# Patient Record
Sex: Female | Born: 1955 | Race: Black or African American | Hispanic: No | Marital: Married | Smoking: Current some day smoker
Health system: Southern US, Community
[De-identification: ages and names within clinical notes are randomized; demographics above are authoritative.]

## PROBLEM LIST (undated history)

## (undated) DIAGNOSIS — I10 Essential (primary) hypertension: Secondary | ICD-10-CM

## (undated) HISTORY — PX: TONSILLECTOMY: SUR1361

## (undated) HISTORY — PX: ABDOMINAL HYSTERECTOMY: SHX81

---

## 1998-10-16 ENCOUNTER — Emergency Department (HOSPITAL_COMMUNITY): Admission: EM | Admit: 1998-10-16 | Discharge: 1998-10-16 | Payer: Self-pay | Admitting: Emergency Medicine

## 1999-12-12 ENCOUNTER — Emergency Department (HOSPITAL_COMMUNITY): Admission: EM | Admit: 1999-12-12 | Discharge: 1999-12-12 | Payer: Self-pay | Admitting: Emergency Medicine

## 1999-12-13 ENCOUNTER — Encounter: Payer: Self-pay | Admitting: Emergency Medicine

## 2000-01-28 ENCOUNTER — Emergency Department (HOSPITAL_COMMUNITY): Admission: EM | Admit: 2000-01-28 | Discharge: 2000-01-28 | Payer: Self-pay | Admitting: Emergency Medicine

## 2000-04-02 ENCOUNTER — Emergency Department (HOSPITAL_COMMUNITY): Admission: EM | Admit: 2000-04-02 | Discharge: 2000-04-02 | Payer: Self-pay | Admitting: Emergency Medicine

## 2000-06-05 ENCOUNTER — Emergency Department (HOSPITAL_COMMUNITY): Admission: EM | Admit: 2000-06-05 | Discharge: 2000-06-05 | Payer: Self-pay | Admitting: Emergency Medicine

## 2000-11-25 ENCOUNTER — Emergency Department (HOSPITAL_COMMUNITY): Admission: EM | Admit: 2000-11-25 | Discharge: 2000-11-26 | Payer: Self-pay | Admitting: *Deleted

## 2002-11-03 ENCOUNTER — Emergency Department (HOSPITAL_COMMUNITY): Admission: EM | Admit: 2002-11-03 | Discharge: 2002-11-03 | Payer: Self-pay | Admitting: Emergency Medicine

## 2004-07-13 ENCOUNTER — Emergency Department (HOSPITAL_COMMUNITY): Admission: EM | Admit: 2004-07-13 | Discharge: 2004-07-13 | Payer: Self-pay | Admitting: Podiatry

## 2006-08-21 ENCOUNTER — Emergency Department (HOSPITAL_COMMUNITY): Admission: EM | Admit: 2006-08-21 | Discharge: 2006-08-21 | Payer: Self-pay | Admitting: Emergency Medicine

## 2006-09-09 ENCOUNTER — Emergency Department (HOSPITAL_COMMUNITY): Admission: EM | Admit: 2006-09-09 | Discharge: 2006-09-09 | Payer: Self-pay | Admitting: Emergency Medicine

## 2007-08-13 ENCOUNTER — Encounter (INDEPENDENT_AMBULATORY_CARE_PROVIDER_SITE_OTHER): Payer: Self-pay | Admitting: Emergency Medicine

## 2007-08-13 ENCOUNTER — Ambulatory Visit: Payer: Self-pay | Admitting: Vascular Surgery

## 2007-08-13 ENCOUNTER — Emergency Department (HOSPITAL_COMMUNITY): Admission: EM | Admit: 2007-08-13 | Discharge: 2007-08-13 | Payer: Self-pay | Admitting: Emergency Medicine

## 2007-08-24 ENCOUNTER — Ambulatory Visit: Payer: Self-pay | Admitting: *Deleted

## 2007-08-24 ENCOUNTER — Ambulatory Visit: Payer: Self-pay | Admitting: Internal Medicine

## 2007-08-24 LAB — CONVERTED CEMR LAB
Chloride: 107 meq/L (ref 96–112)
HDL: 56 mg/dL (ref >39)
LDL Cholesterol: 75 mg/dL (ref 0–99)
Potassium: 4.1 meq/L (ref 3.5–5.3)
Triglycerides: 125 mg/dL (ref <150)
VLDL: 25 mg/dL (ref 0–40)

## 2007-11-21 ENCOUNTER — Ambulatory Visit: Payer: Self-pay | Admitting: Internal Medicine

## 2008-11-25 ENCOUNTER — Emergency Department (HOSPITAL_COMMUNITY): Admission: EM | Admit: 2008-11-25 | Discharge: 2008-11-25 | Payer: Self-pay | Admitting: Emergency Medicine

## 2009-03-11 ENCOUNTER — Ambulatory Visit: Payer: Self-pay | Admitting: Internal Medicine

## 2009-03-12 ENCOUNTER — Encounter (INDEPENDENT_AMBULATORY_CARE_PROVIDER_SITE_OTHER): Payer: Self-pay | Admitting: Internal Medicine

## 2009-03-25 ENCOUNTER — Encounter: Admission: RE | Admit: 2009-03-25 | Discharge: 2009-05-14 | Payer: Self-pay | Admitting: Internal Medicine

## 2009-04-23 ENCOUNTER — Ambulatory Visit: Payer: Self-pay | Admitting: Internal Medicine

## 2009-04-30 ENCOUNTER — Ambulatory Visit (HOSPITAL_COMMUNITY): Admission: RE | Admit: 2009-04-30 | Discharge: 2009-04-30 | Payer: Self-pay | Admitting: Internal Medicine

## 2009-05-14 ENCOUNTER — Ambulatory Visit: Payer: Self-pay | Admitting: Internal Medicine

## 2009-08-11 ENCOUNTER — Ambulatory Visit: Payer: Self-pay | Admitting: Internal Medicine

## 2009-08-16 ENCOUNTER — Observation Stay (HOSPITAL_COMMUNITY): Admission: EM | Admit: 2009-08-16 | Discharge: 2009-08-16 | Payer: Self-pay | Admitting: Emergency Medicine

## 2009-08-18 ENCOUNTER — Emergency Department (HOSPITAL_COMMUNITY): Admission: EM | Admit: 2009-08-18 | Discharge: 2009-08-18 | Payer: Self-pay | Admitting: Family Medicine

## 2009-10-13 ENCOUNTER — Encounter (INDEPENDENT_AMBULATORY_CARE_PROVIDER_SITE_OTHER): Payer: Self-pay | Admitting: Adult Health

## 2009-10-13 ENCOUNTER — Ambulatory Visit: Payer: Self-pay | Admitting: Internal Medicine

## 2009-10-13 LAB — CONVERTED CEMR LAB
AST: 15 units/L (ref 0–37)
Alkaline Phosphatase: 87 units/L (ref 39–117)
BUN: 15 mg/dL (ref 6–23)
Creatinine, Ser: 0.88 mg/dL (ref 0.40–1.20)
HDL: 53 mg/dL (ref >39)
LDL Cholesterol: 92 mg/dL (ref 0–99)
Total Bilirubin: 0.5 mg/dL (ref 0.3–1.2)
Total CHOL/HDL Ratio: 3.1

## 2010-02-03 ENCOUNTER — Ambulatory Visit: Payer: Self-pay | Admitting: Internal Medicine

## 2011-01-21 ENCOUNTER — Emergency Department (HOSPITAL_COMMUNITY)
Admission: EM | Admit: 2011-01-21 | Discharge: 2011-01-21 | Disposition: A | Discharge status: Home / Self Care | Payer: Self-pay | Attending: Emergency Medicine | Admitting: Emergency Medicine

## 2011-01-21 DIAGNOSIS — I10 Essential (primary) hypertension: Secondary | ICD-10-CM | POA: Insufficient documentation

## 2011-01-21 DIAGNOSIS — E669 Obesity, unspecified: Secondary | ICD-10-CM | POA: Insufficient documentation

## 2011-01-21 DIAGNOSIS — R51 Headache: Secondary | ICD-10-CM | POA: Insufficient documentation

## 2011-01-21 DIAGNOSIS — H5710 Ocular pain, unspecified eye: Secondary | ICD-10-CM | POA: Insufficient documentation

## 2011-01-21 DIAGNOSIS — I2510 Atherosclerotic heart disease of native coronary artery without angina pectoris: Secondary | ICD-10-CM | POA: Insufficient documentation

## 2011-02-12 ENCOUNTER — Encounter (INDEPENDENT_AMBULATORY_CARE_PROVIDER_SITE_OTHER): Payer: Self-pay | Admitting: *Deleted

## 2011-02-12 LAB — CONVERTED CEMR LAB
Calcium: 8.9 mg/dL (ref 8.4–10.5)
Glucose, Bld: 96 mg/dL (ref 70–99)
Sodium: 140 meq/L (ref 135–145)

## 2011-07-20 ENCOUNTER — Emergency Department (HOSPITAL_COMMUNITY): Payer: Self-pay

## 2011-07-20 ENCOUNTER — Emergency Department (HOSPITAL_COMMUNITY)
Admission: EM | Admit: 2011-07-20 | Discharge: 2011-07-20 | Disposition: A | Discharge status: Home / Self Care | Payer: Self-pay | Attending: Emergency Medicine | Admitting: Emergency Medicine

## 2011-07-20 DIAGNOSIS — I2510 Atherosclerotic heart disease of native coronary artery without angina pectoris: Secondary | ICD-10-CM | POA: Insufficient documentation

## 2011-07-20 DIAGNOSIS — K6289 Other specified diseases of anus and rectum: Secondary | ICD-10-CM | POA: Insufficient documentation

## 2011-07-20 DIAGNOSIS — K602 Anal fissure, unspecified: Secondary | ICD-10-CM | POA: Insufficient documentation

## 2011-07-20 DIAGNOSIS — R109 Unspecified abdominal pain: Secondary | ICD-10-CM | POA: Insufficient documentation

## 2011-07-20 DIAGNOSIS — I10 Essential (primary) hypertension: Secondary | ICD-10-CM | POA: Insufficient documentation

## 2011-07-20 DIAGNOSIS — K644 Residual hemorrhoidal skin tags: Secondary | ICD-10-CM | POA: Insufficient documentation

## 2011-07-20 LAB — URINE MICROSCOPIC-ADD ON

## 2011-07-20 LAB — URINALYSIS, ROUTINE W REFLEX MICROSCOPIC
Glucose, UA: NEGATIVE mg/dL
Nitrite: NEGATIVE
Protein, ur: NEGATIVE mg/dL
Urobilinogen, UA: 0.2 mg/dL (ref 0.0–1.0)

## 2011-07-20 LAB — POCT I-STAT, CHEM 8
BUN: 15 mg/dL (ref 6–23)
Chloride: 101 mEq/L (ref 96–112)
Sodium: 140 mEq/L (ref 135–145)
TCO2: 30 mmol/L (ref 0–100)

## 2011-07-20 MED ORDER — IOHEXOL 300 MG/ML  SOLN
100.0000 mL | Freq: Once | INTRAMUSCULAR | Status: AC | PRN
  Administered 2011-07-20: 100 mL via INTRAVENOUS

## 2014-02-21 ENCOUNTER — Emergency Department (HOSPITAL_COMMUNITY): Payer: PRIVATE HEALTH INSURANCE

## 2014-02-21 ENCOUNTER — Emergency Department (HOSPITAL_COMMUNITY)
Admission: EM | Admit: 2014-02-21 | Discharge: 2014-02-21 | Disposition: A | Discharge status: Home / Self Care | Payer: PRIVATE HEALTH INSURANCE | Attending: Emergency Medicine | Admitting: Emergency Medicine

## 2014-02-21 ENCOUNTER — Encounter (HOSPITAL_COMMUNITY): Payer: Self-pay | Admitting: Emergency Medicine

## 2014-02-21 DIAGNOSIS — M25512 Pain in left shoulder: Secondary | ICD-10-CM

## 2014-02-21 DIAGNOSIS — F17200 Nicotine dependence, unspecified, uncomplicated: Secondary | ICD-10-CM | POA: Insufficient documentation

## 2014-02-21 DIAGNOSIS — Y9389 Activity, other specified: Secondary | ICD-10-CM | POA: Insufficient documentation

## 2014-02-21 DIAGNOSIS — I10 Essential (primary) hypertension: Secondary | ICD-10-CM | POA: Insufficient documentation

## 2014-02-21 DIAGNOSIS — Z880 Allergy status to penicillin: Secondary | ICD-10-CM | POA: Insufficient documentation

## 2014-02-21 DIAGNOSIS — S199XXA Unspecified injury of neck, initial encounter: Principal | ICD-10-CM

## 2014-02-21 DIAGNOSIS — M542 Cervicalgia: Secondary | ICD-10-CM

## 2014-02-21 DIAGNOSIS — S4980XA Other specified injuries of shoulder and upper arm, unspecified arm, initial encounter: Secondary | ICD-10-CM | POA: Insufficient documentation

## 2014-02-21 DIAGNOSIS — S46909A Unspecified injury of unspecified muscle, fascia and tendon at shoulder and upper arm level, unspecified arm, initial encounter: Secondary | ICD-10-CM | POA: Insufficient documentation

## 2014-02-21 DIAGNOSIS — S0993XA Unspecified injury of face, initial encounter: Secondary | ICD-10-CM | POA: Insufficient documentation

## 2014-02-21 DIAGNOSIS — Y92410 Unspecified street and highway as the place of occurrence of the external cause: Secondary | ICD-10-CM | POA: Insufficient documentation

## 2014-02-21 HISTORY — DX: Essential (primary) hypertension: I10

## 2014-02-21 MED ORDER — HYDROCODONE-ACETAMINOPHEN 5-325 MG PO TABS: 1.0000 | ORAL_TABLET | ORAL | Status: DC | PRN

## 2014-02-21 MED ORDER — OXYCODONE-ACETAMINOPHEN 5-325 MG PO TABS
1.0000 | ORAL_TABLET | Freq: Once | ORAL | Status: AC
  Administered 2014-02-21: 1 via ORAL
  Filled 2014-02-21: qty 1

## 2014-02-21 NOTE — ED Notes (Signed)
Patient transported to CT 
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
## 2014-02-21 NOTE — Discharge Instructions (Signed)
Take the prescribed medication as directed.  You will continue to be sore for the next several days.  Wear collar if it helps your neck pain. Return to the ED for new or worsening symptoms.
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
## 2014-02-24 NOTE — ED Provider Notes (Signed)
Medical screening examination/treatment/procedure(s) were performed by non-physician practitioner and as supervising physician I was immediately available for consultation/collaboration.   EKG Interpretation None        Candyce ChurnJohn David Tacie Mccuistion III, MD 02/24/14 1254

## 2016-05-04 ENCOUNTER — Emergency Department (HOSPITAL_COMMUNITY)
Admission: EM | Admit: 2016-05-04 | Discharge: 2016-05-05 | Disposition: A | Discharge status: Home / Self Care | Payer: Medicare Other | Attending: Emergency Medicine | Admitting: Emergency Medicine

## 2016-05-04 ENCOUNTER — Encounter (HOSPITAL_COMMUNITY): Payer: Self-pay | Admitting: *Deleted

## 2016-05-04 DIAGNOSIS — Z880 Allergy status to penicillin: Secondary | ICD-10-CM | POA: Diagnosis not present

## 2016-05-04 DIAGNOSIS — R350 Frequency of micturition: Secondary | ICD-10-CM | POA: Diagnosis present

## 2016-05-04 DIAGNOSIS — N390 Urinary tract infection, site not specified: Secondary | ICD-10-CM

## 2016-05-04 DIAGNOSIS — I10 Essential (primary) hypertension: Secondary | ICD-10-CM | POA: Diagnosis not present

## 2016-05-04 DIAGNOSIS — F17200 Nicotine dependence, unspecified, uncomplicated: Secondary | ICD-10-CM | POA: Diagnosis not present

## 2016-05-04 NOTE — ED Notes (Signed)
Patient states she has been having frequent urination for about 1 month.  Denies vaginal discharge or painful urination

## 2016-05-05 LAB — URINE MICROSCOPIC-ADD ON

## 2016-05-05 LAB — URINALYSIS, ROUTINE W REFLEX MICROSCOPIC
GLUCOSE, UA: NEGATIVE mg/dL
KETONES UR: 15 mg/dL — AB
LEUKOCYTES UA: NEGATIVE
Nitrite: NEGATIVE
PROTEIN: NEGATIVE mg/dL
Specific Gravity, Urine: 1.027 (ref 1.005–1.030)
pH: 6 (ref 5.0–8.0)

## 2016-05-05 LAB — CBG MONITORING, ED: Glucose-Capillary: 111 mg/dL — ABNORMAL HIGH (ref 65–99)

## 2016-05-05 MED ORDER — CEPHALEXIN 500 MG PO CAPS: 500.0000 mg | ORAL_CAPSULE | Freq: Four times a day (QID) | ORAL | Status: AC

## 2016-05-05 NOTE — ED Provider Notes (Signed)
TIME SEEN: 12:27 AM  CHIEF COMPLAINT: Urinary frequency  HPI: HPI Comments: Andrea Carr is a 60 y.o. female who presents to the Emergency Department complaining of urinary frequency onset 1 month prior. Pt doesn't report any associated symptoms. Pt states that her symptoms are worse at night when sleeping. Pt states sometimes at night she urinates at night about 10-12 times. Pt doesn't report any alleviating factors. Denies polydipsia, dysuria, hematuria, abdominal pain, vomiting, diarrhea, vaginal bleeding or vaginal discharge. Denies Hx of DM. Pt denies being on an diuretics.  ROS: See HPI Constitutional: no fever  Eyes: no drainage  ENT: no runny nose   Cardiovascular:  no chest pain  Resp: no SOB  GI: no vomiting GU: no dysuria Integumentary: no rash  Allergy: no hives  Musculoskeletal: no leg swelling  Neurological: no slurred speech ROS otherwise negative  PAST MEDICAL HISTORY/PAST SURGICAL HISTORY:  Past Medical History  Diagnosis Date  . Hypertension     MEDICATIONS:  Prior to Admission medications   Medication Sig Start Date End Date Taking? Authorizing Provider  acetaminophen (TYLENOL) 500 MG tablet Take 500 mg by mouth every 6 (six) hours as needed for mild pain.   Yes Historical Provider, MD  ranitidine (ZANTAC) 150 MG capsule Take 150 mg by mouth daily as needed for heartburn.   Yes Historical Provider, MD  HYDROcodone-acetaminophen (NORCO/VICODIN) 5-325 MG per tablet Take 1 tablet by mouth every 4 (four) hours as needed. Patient not taking: Reported on 05/05/2016 02/21/14   Garlon Hatchet, PA-C    ALLERGIES:  Allergies  Allergen Reactions  . Lisinopril Swelling  . Penicillins     Unknown Has patient had a PCN reaction causing immediate rash, facial/tongue/throat swelling, SOB or lightheadedness with hypotension:YES Has patient had a PCN reaction causing severe rash involving mucus membranes or skin necrosis:NO Has patient had a PCN reaction that required  hospitalization NO Has patient had a PCN reaction occurring within the last 10 years: NO If all of the above answers are "NO", then may proceed with Cephalosporin use.    SOCIAL HISTORY:  Social History  Substance Use Topics  . Smoking status: Current Some Day Smoker  . Smokeless tobacco: Never Used  . Alcohol Use: No    FAMILY HISTORY: No family history on file.  EXAM: BP 176/95 mmHg  Pulse 76  Temp(Src) 98.3 F (36.8 C) (Oral)  Resp 20  Ht  (1.676 m)  Wt 350 lb (158.759 kg)  BMI 56.52 kg/m2  SpO2 96% CONSTITUTIONAL: Alert and oriented and responds appropriately to questions. Well-appearing; well-nourished, Morbidly obese HEAD: Normocephalic EYES: Conjunctivae clear, PERRL ENT: normal nose; no rhinorrhea; moist mucous membranes NECK: Supple, no meningismus, no LAD  CARD: RRR; S1 and S2 appreciated; no murmurs, no clicks, no rubs, no gallops RESP: Normal chest excursion without splinting or tachypnea; breath sounds clear and equal bilaterally; no wheezes, no rhonchi, no rales, no hypoxia or respiratory distress, speaking full sentences ABD/GI: Normal bowel sounds; non-distended; soft, non-tender, no rebound, no guarding, no peritoneal signs BACK:  The back appears normal and is non-tender to palpation, there is no CVA tenderness EXT: Normal ROM in all joints; non-tender to palpation; no edema; normal capillary refill; no cyanosis, no calf tenderness or swelling    SKIN: Normal color for age and race; warm; no rash NEURO: Moves all extremities equally, sensation to light touch intact diffusely, cranial nerves II through XII intact PSYCH: The patient's mood and manner are appropriate. Grooming and personal hygiene are  appropriate.  MEDICAL DECISION MAKING: Patient here with urinary frequency for the past month. No abdominal pain, fever, vomiting or diarrhea. No vaginal bleeding or discharge. Abdominal exam is completely benign. Afebrile. Blood glucose normal. Not on  diuretics. Urine shows trace hemoglobin, bacteria. Will treat for possible UTI. Culture is pending. Will discharge on Keflex. She does not have a PCP for follow-up. I have provided her with outpatient follow-up information.  Discussed return precautions.   At this time, I do not feel there is any life-threatening condition present. I have reviewed and discussed all results (EKG, imaging, lab, urine as appropriate), exam findings with patient. I have reviewed nursing notes and appropriate previous records.  I feel the patient is safe to be discharged home without further emergent workup. Discussed usual and customary return precautions. Patient and family (if present) verbalize understanding and are comfortable with this plan.  Patient will follow-up with their primary care provider. If they do not have a primary care provider, information for follow-up has been provided to them. All questions have been answered.   I personally performed the services described in this documentation, which was scribed in my presence. The recorded information has been reviewed and is accurate.     Layla MawKristen N Ward, DO 05/05/16 [phone]

## 2016-05-05 NOTE — Discharge Instructions (Signed)
Urinary Tract Infection °Urinary tract infections (UTIs) can develop anywhere along your urinary tract. Your urinary tract is your body's drainage system for removing wastes and extra water. Your urinary tract includes two kidneys, two ureters, a bladder, and a urethra. Your kidneys are a pair of bean-shaped organs. Each kidney is about the size of your fist. They are located below your ribs, one on each side of your spine. °CAUSES °Infections are caused by microbes, which are microscopic organisms, including fungi, viruses, and bacteria. These organisms are so small that they can only be seen through a microscope. Bacteria are the microbes that most commonly cause UTIs. °SYMPTOMS  °Symptoms of UTIs may vary by age and gender of the patient and by the location of the infection. Symptoms in young women typically include a frequent and intense urge to urinate and a painful, burning feeling in the bladder or urethra during urination. Older women and men are more likely to be tired, shaky, and weak and have muscle aches and abdominal pain. A fever may mean the infection is in your kidneys. Other symptoms of a kidney infection include pain in your back or sides below the ribs, nausea, and vomiting. °DIAGNOSIS °To diagnose a UTI, your caregiver will ask you about your symptoms. Your caregiver will also ask you to provide a urine sample. The urine sample will be tested for bacteria and white blood cells. White blood cells are made by your body to help fight infection. °TREATMENT  °Typically, UTIs can be treated with medication. Because most UTIs are caused by a bacterial infection, they usually can be treated with the use of antibiotics. The choice of antibiotic and length of treatment depend on your symptoms and the type of bacteria causing your infection. °HOME CARE INSTRUCTIONS °· If you were prescribed antibiotics, take them exactly as your caregiver instructs you. Finish the medication even if you feel better after  you have only taken some of the medication. °· Drink enough water and fluids to keep your urine clear or pale yellow. °· Avoid caffeine, tea, and carbonated beverages. They tend to irritate your bladder. °· Empty your bladder often. Avoid holding urine for long periods of time. °· Empty your bladder before and after sexual intercourse. °· After a bowel movement, women should cleanse from front to back. Use each tissue only once. °SEEK MEDICAL CARE IF:  °· You have back pain. °· You develop a fever. °· Your symptoms do not begin to resolve within 3 days. °SEEK IMMEDIATE MEDICAL CARE IF:  °· You have severe back pain or lower abdominal pain. °· You develop chills. °· You have nausea or vomiting. °· You have continued burning or discomfort with urination. °MAKE SURE YOU:  °· Understand these instructions. °· Will watch your condition. °· Will get help right away if you are not doing well or get worse. °  °This information is not intended to replace advice given to you by your health care provider. Make sure you discuss any questions you have with your health care provider. °  °Document Released: 08/25/2005 Document Revised: 08/06/2015 Document Reviewed: 12/24/2011 °Elsevier Interactive Patient Education ©2016 Elsevier Inc. ° ° °To find a primary care or specialty doctor please call [phone] or [phone] to access "[facility] Find a Doctor Service." ° °You may also go on the [facility] website at [email or web address] ° °There are also multiple [facility] and [facility] practices throughout the [place] that are frequently accepting new patients. You may find a clinic that is   is close to your home and contact them.  [facility] -  [address] [place] [id number] [phone]  [facility] in [place] (also locations in [place] and [place]) -  [address] [place] [address] [phone]  [facility] -  [address]  [place] [address] [phone]

## 2016-05-06 LAB — URINE CULTURE

## 2017-11-03 ENCOUNTER — Encounter (HOSPITAL_COMMUNITY): Payer: Self-pay

## 2017-11-03 ENCOUNTER — Emergency Department (HOSPITAL_COMMUNITY)
Admission: EM | Admit: 2017-11-03 | Discharge: 2017-11-03 | Disposition: A | Discharge status: Home / Self Care | Payer: Medicare Other | Attending: Emergency Medicine | Admitting: Emergency Medicine

## 2017-11-03 ENCOUNTER — Other Ambulatory Visit: Payer: Self-pay

## 2017-11-03 DIAGNOSIS — F17210 Nicotine dependence, cigarettes, uncomplicated: Secondary | ICD-10-CM | POA: Insufficient documentation

## 2017-11-03 DIAGNOSIS — L299 Pruritus, unspecified: Secondary | ICD-10-CM | POA: Insufficient documentation

## 2017-11-03 DIAGNOSIS — R3915 Urgency of urination: Secondary | ICD-10-CM | POA: Diagnosis not present

## 2017-11-03 DIAGNOSIS — R21 Rash and other nonspecific skin eruption: Secondary | ICD-10-CM | POA: Insufficient documentation

## 2017-11-03 LAB — URINALYSIS, ROUTINE W REFLEX MICROSCOPIC
Bilirubin Urine: NEGATIVE
Glucose, UA: NEGATIVE mg/dL
Ketones, ur: NEGATIVE mg/dL
Nitrite: NEGATIVE
Protein, ur: 30 mg/dL — AB
Specific Gravity, Urine: 1.027 (ref 1.005–1.030)
pH: 5 (ref 5.0–8.0)

## 2017-11-03 MED ORDER — HYDROCORTISONE 1 % EX CREA: TOPICAL_CREAM | CUTANEOUS | 0 refills | Status: AC

## 2017-11-03 MED ORDER — LORATADINE 10 MG PO TABS: 10.0000 mg | ORAL_TABLET | Freq: Every day | ORAL | 0 refills | Status: AC

## 2017-11-03 NOTE — Discharge Instructions (Addendum)
Please read attached information. If you experience any new or worsening signs or symptoms please return to the emergency room for evaluation. Please follow-up with your primary care provider or specialist as discussed. Please use medication prescribed only as directed and discontinue taking if you have any concerning signs or symptoms.   °

## 2017-11-03 NOTE — ED Notes (Signed)
Hat placed in bathroom, walker given to patient. Patient aware of UA needed. Patient unsure whether or not she has to pee, but "wants to drink water to be sure".

## 2017-11-03 NOTE — ED Provider Notes (Signed)
[name] Provider Note   CSN: [id number] Arrival date & time: 11/03/17  1138     History   Chief Complaint No chief complaint on file.   HPI [name] is a 61 y.o. female.  HPI     61 year old female presents today with rash.  Patient notes rash has been present for the last month.  She notes is diffuse to her skin sparing the palms and the soles of her feet.  No intraoral involvement.  She notes extremely itchy and red.  She notes that she thought this was from her dog who recently had fleas.  She notes she is treated the dog for fleas.  Patient notes she is tried Benadryl without dramatic improvement.  Patient denies any systemic illnesses including fever, no intraoral involvement.  She also notes a several year history of dysuria, she reports that this is worse at night, reports it is been worse recently but is unable to quantify how many days.  She notes usually when she has the symptoms she is diagnosed with urinary tract infection.   History reviewed. No pertinent past medical history.  There are no active problems to display for this patient.   Past Surgical History:  Procedure Laterality Date  . ABDOMINAL HYSTERECTOMY    . TONSILLECTOMY      OB History    No data available      Home Medications    Prior to Admission medications   Medication Sig Start Date End Date Taking? Authorizing Provider  acetaminophen (TYLENOL) 500 MG tablet Take 500 mg by mouth every 6 (six) hours as needed for mild pain.    [provider]  cephALEXin (KEFLEX) 500 MG capsule Take 1 capsule (500 mg total) by mouth 4 (four) times daily. 05/05/16   Ward, Layla MawKristen N, DO  hydrocortisone cream 1 % Apply to affected area 2 times daily 11/03/17   Celeste Tavenner, Tinnie GensJeffrey, PA-C  loratadine (CLARITIN) 10 MG tablet Take 1 tablet (10 mg total) by mouth daily. 11/03/17   Sava Proby, Tinnie GensJeffrey, PA-C  ranitidine (ZANTAC) 150 MG capsule Take 150 mg by mouth daily as  needed for heartburn.    [provider]    Family History No family history on file.  Social History Social History   Tobacco Use  . Smoking status: Current Some Day Smoker  . Smokeless tobacco: Never Used  Substance Use Topics  . Alcohol use: No  . Drug use: No     Allergies   Lisinopril and Penicillins   Review of Systems Review of Systems  All other systems reviewed and are negative.    Physical Exam Updated Vital Signs BP (!) 157/103 (BP Location: Left Arm)   Pulse 64   Temp 98.5 F (36.9 C) (Oral)   Resp 18   Ht 5\' 6"  (1.676 m)   Wt (!) 145.2 kg (320 lb)   SpO2 100%   BMI 51.65 kg/m   Physical Exam  Constitutional: She is oriented to person, place, and time. She appears well-developed and well-nourished.  HENT:  Head: Normocephalic and atraumatic.  Eyes: Conjunctivae are normal. Pupils are equal, round, and reactive to light. Right eye exhibits no discharge. Left eye exhibits no discharge. No scleral icterus.  Neck: Normal range of motion. No JVD present. No tracheal deviation present.  Pulmonary/Chest: Effort normal. No stridor.  Neurological: She is alert and oriented to person, place, and time. Coordination normal.  Skin:  Pruritic papular rash to dermatome diffusely sparing  the face hands and feet-numerous areas of excoriation-no signs of secondary infection  Psychiatric: She has a normal mood and affect. Her behavior is normal. Judgment and thought content normal.  Nursing note and vitals reviewed.    ED Treatments / Results  Labs (all labs ordered are listed, but only abnormal results are displayed) Labs Reviewed  URINALYSIS, ROUTINE W REFLEX MICROSCOPIC - Abnormal; Notable for the following components:      Result Value   Color, Urine AMBER (*)    APPearance CLOUDY (*)    Hgb urine dipstick SMALL (*)    Protein, ur 30 (*)    Leukocytes, UA TRACE (*)    Bacteria, UA MANY (*)    Squamous Epithelial / LPF 6-30 (*)    All other  components within normal limits    EKG  EKG Interpretation None       Radiology No results found.  Procedures Procedures (including critical care time)  Medications Ordered in ED Medications - No data to display   Initial Impression / Assessment and Plan / ED Course  I have reviewed the triage vital signs and the nursing notes.  Pertinent labs & imaging results that were available during my care of the patient were reviewed by me and considered in my medical decision making (see chart for details).      Final Clinical Impressions(s) / ED Diagnoses   Final diagnoses:  Rash  Urinary urgency    Labs: urinalysis  Imaging:  Consults:  Therapeutics:  Discharge Meds: Hydrocortisone, Claritin  Assessment/Plan: 61 year old female presents today with numerous complaints.  Patient reports a rash, this is likely contact dermatitis.  No signs of secondary infection.  She will be treated with hydrocortisone cream, removal of potential allergens, and Claritin.  Patient also having urinary urgency, she describes this is having 2 years of the symptoms.  Her urine is not convincing for urinary tract infection.  Patient is encouraged to follow-up as an outpatient for ongoing management of her symptoms.  Return immediately with any new or worsening signs or symptoms.  Patient verbalized understanding and agreement to today's plan had no further questions or concerns.    ED Discharge Orders        Ordered    hydrocortisone cream 1 %     11/03/17 1433    loratadine (CLARITIN) 10 MG tablet  Daily     11/03/17 1433       Eyvonne MechanicHedges, Hildreth Orsak, PA-C 11/03/17 1647    Little, Ambrose Finlandachel Morgan, MD 11/03/17 2035

## 2017-11-03 NOTE — ED Triage Notes (Signed)
Patient complains of 1 month of itchy rash to body, states that she has switched detergents and lotions with no improvement, NAD

## 2019-11-24 ENCOUNTER — Emergency Department (HOSPITAL_COMMUNITY): Payer: Medicare Other

## 2019-11-24 ENCOUNTER — Inpatient Hospital Stay (HOSPITAL_COMMUNITY): Payer: Medicare Other

## 2019-11-24 ENCOUNTER — Other Ambulatory Visit (HOSPITAL_COMMUNITY): Payer: Medicare Other

## 2019-11-24 ENCOUNTER — Inpatient Hospital Stay (HOSPITAL_COMMUNITY)
Admission: EM | Admit: 2019-11-24 | Discharge: 2019-11-30 | DRG: 064 | Disposition: E | Payer: Medicare Other | Attending: Pulmonary Disease | Admitting: Pulmonary Disease

## 2019-11-24 DIAGNOSIS — J189 Pneumonia, unspecified organism: Secondary | ICD-10-CM | POA: Diagnosis present

## 2019-11-24 DIAGNOSIS — J80 Acute respiratory distress syndrome: Secondary | ICD-10-CM | POA: Diagnosis present

## 2019-11-24 DIAGNOSIS — I607 Nontraumatic subarachnoid hemorrhage from unspecified intracranial artery: Principal | ICD-10-CM | POA: Diagnosis present

## 2019-11-24 DIAGNOSIS — J9601 Acute respiratory failure with hypoxia: Secondary | ICD-10-CM | POA: Diagnosis not present

## 2019-11-24 DIAGNOSIS — I469 Cardiac arrest, cause unspecified: Secondary | ICD-10-CM | POA: Diagnosis present

## 2019-11-24 DIAGNOSIS — R040 Epistaxis: Secondary | ICD-10-CM | POA: Diagnosis present

## 2019-11-24 DIAGNOSIS — F17200 Nicotine dependence, unspecified, uncomplicated: Secondary | ICD-10-CM | POA: Diagnosis present

## 2019-11-24 DIAGNOSIS — G253 Myoclonus: Secondary | ICD-10-CM | POA: Diagnosis not present

## 2019-11-24 DIAGNOSIS — Z515 Encounter for palliative care: Secondary | ICD-10-CM | POA: Diagnosis not present

## 2019-11-24 DIAGNOSIS — R579 Shock, unspecified: Secondary | ICD-10-CM | POA: Diagnosis not present

## 2019-11-24 DIAGNOSIS — Z880 Allergy status to penicillin: Secondary | ICD-10-CM | POA: Diagnosis not present

## 2019-11-24 DIAGNOSIS — R0489 Hemorrhage from other sites in respiratory passages: Secondary | ICD-10-CM | POA: Diagnosis not present

## 2019-11-24 DIAGNOSIS — Z66 Do not resuscitate: Secondary | ICD-10-CM | POA: Diagnosis not present

## 2019-11-24 DIAGNOSIS — Z6841 Body Mass Index (BMI) 40.0 and over, adult: Secondary | ICD-10-CM | POA: Diagnosis not present

## 2019-11-24 DIAGNOSIS — Z20828 Contact with and (suspected) exposure to other viral communicable diseases: Secondary | ICD-10-CM | POA: Diagnosis present

## 2019-11-24 DIAGNOSIS — K922 Gastrointestinal hemorrhage, unspecified: Secondary | ICD-10-CM | POA: Diagnosis present

## 2019-11-24 DIAGNOSIS — S0083XA Contusion of other part of head, initial encounter: Secondary | ICD-10-CM | POA: Diagnosis present

## 2019-11-24 DIAGNOSIS — Z888 Allergy status to other drugs, medicaments and biological substances status: Secondary | ICD-10-CM | POA: Diagnosis not present

## 2019-11-24 DIAGNOSIS — I609 Nontraumatic subarachnoid hemorrhage, unspecified: Secondary | ICD-10-CM | POA: Diagnosis not present

## 2019-11-24 DIAGNOSIS — E872 Acidosis: Secondary | ICD-10-CM | POA: Diagnosis present

## 2019-11-24 DIAGNOSIS — G931 Anoxic brain damage, not elsewhere classified: Secondary | ICD-10-CM | POA: Diagnosis present

## 2019-11-24 DIAGNOSIS — I10 Essential (primary) hypertension: Secondary | ICD-10-CM | POA: Diagnosis present

## 2019-11-24 DIAGNOSIS — R001 Bradycardia, unspecified: Secondary | ICD-10-CM | POA: Diagnosis present

## 2019-11-24 DIAGNOSIS — G936 Cerebral edema: Secondary | ICD-10-CM | POA: Diagnosis present

## 2019-11-24 LAB — BRAIN NATRIURETIC PEPTIDE: B Natriuretic Peptide: 161.7 pg/mL — ABNORMAL HIGH (ref 0.0–100.0)

## 2019-11-24 LAB — CBC
HCT: 52.5 % — ABNORMAL HIGH (ref 36.0–46.0)
HCT: 53.1 % — ABNORMAL HIGH (ref 36.0–46.0)
Hemoglobin: 16 g/dL — ABNORMAL HIGH (ref 12.0–15.0)
Hemoglobin: 17.1 g/dL — ABNORMAL HIGH (ref 12.0–15.0)
MCH: 31.8 pg (ref 26.0–34.0)
MCH: 31.6 pg (ref 26.0–34.0)
MCHC: 30.5 g/dL (ref 30.0–36.0)
MCHC: 32.2 g/dL (ref 30.0–36.0)
MCV: 104.4 fL — ABNORMAL HIGH (ref 80.0–100.0)
MCV: 98.2 fL (ref 80.0–100.0)
Platelets: 181 10*3/uL (ref 150–400)
Platelets: 75 10*3/uL — ABNORMAL LOW (ref 150–400)
RBC: 5.03 MIL/uL (ref 3.87–5.11)
RBC: 5.41 MIL/uL — ABNORMAL HIGH (ref 3.87–5.11)
RDW: 14.1 % (ref 11.5–15.5)
RDW: 14.1 % (ref 11.5–15.5)
WBC: 19.9 10*3/uL — ABNORMAL HIGH (ref 4.0–10.5)
WBC: 10.8 10*3/uL — ABNORMAL HIGH (ref 4.0–10.5)
nRBC: 0.2 % (ref 0.0–0.2)
nRBC: 0 % (ref 0.0–0.2)

## 2019-11-24 LAB — URINALYSIS, ROUTINE W REFLEX MICROSCOPIC
Bilirubin Urine: NEGATIVE
Glucose, UA: 50 mg/dL — AB
Ketones, ur: NEGATIVE mg/dL
Leukocytes,Ua: NEGATIVE
Nitrite: NEGATIVE
Protein, ur: 100 mg/dL — AB
RBC / HPF: >50 RBC/hpf — ABNORMAL HIGH (ref 0–5)
Specific Gravity, Urine: 1.023 (ref 1.005–1.030)
pH: 6 (ref 5.0–8.0)

## 2019-11-24 LAB — POCT I-STAT 7, (LYTES, BLD GAS, ICA,H+H)
Acid-base deficit: 6 mmol/L — ABNORMAL HIGH (ref 0.0–2.0)
Acid-base deficit: 5 mmol/L — ABNORMAL HIGH (ref 0.0–2.0)
Bicarbonate: 23.2 mmol/L (ref 20.0–28.0)
Bicarbonate: 24 mmol/L (ref 20.0–28.0)
Calcium, Ion: 1.17 mmol/L (ref 1.15–1.40)
Calcium, Ion: 1.14 mmol/L — ABNORMAL LOW (ref 1.15–1.40)
HCT: 50 % — ABNORMAL HIGH (ref 36.0–46.0)
HCT: 50 % — ABNORMAL HIGH (ref 36.0–46.0)
Hemoglobin: 17 g/dL — ABNORMAL HIGH (ref 12.0–15.0)
Hemoglobin: 17 g/dL — ABNORMAL HIGH (ref 12.0–15.0)
O2 Saturation: 97 %
O2 Saturation: 84 %
Patient temperature: 93.9
Patient temperature: 32.7
Potassium: 4.1 mmol/L (ref 3.5–5.1)
Potassium: 4 mmol/L (ref 3.5–5.1)
Sodium: 141 mmol/L (ref 135–145)
Sodium: 139 mmol/L (ref 135–145)
TCO2: 25 mmol/L (ref 22–32)
TCO2: 26 mmol/L (ref 22–32)
pCO2 arterial: 49.9 mmHg — ABNORMAL HIGH (ref 32.0–48.0)
pCO2 arterial: 47.6 mmHg (ref 32.0–48.0)
pH, Arterial: 7.261 — ABNORMAL LOW (ref 7.350–7.450)
pH, Arterial: 7.286 — ABNORMAL LOW (ref 7.350–7.450)
pO2, Arterial: 90 mmHg (ref 83.0–108.0)
pO2, Arterial: 44 mmHg — ABNORMAL LOW (ref 83.0–108.0)

## 2019-11-24 LAB — COMPREHENSIVE METABOLIC PANEL
ALT: 115 U/L — ABNORMAL HIGH (ref 0–44)
ALT: 88 U/L — ABNORMAL HIGH (ref 0–44)
AST: 216 U/L — ABNORMAL HIGH (ref 15–41)
AST: 166 U/L — ABNORMAL HIGH (ref 15–41)
Albumin: 3.4 g/dL — ABNORMAL LOW (ref 3.5–5.0)
Albumin: 2.8 g/dL — ABNORMAL LOW (ref 3.5–5.0)
Alkaline Phosphatase: 165 U/L — ABNORMAL HIGH (ref 38–126)
Alkaline Phosphatase: 106 U/L (ref 38–126)
Anion gap: 17 — ABNORMAL HIGH (ref 5–15)
Anion gap: 12 (ref 5–15)
BUN: 16 mg/dL (ref 8–23)
BUN: 27 mg/dL — ABNORMAL HIGH (ref 8–23)
CO2: 17 mmol/L — ABNORMAL LOW (ref 22–32)
CO2: 18 mmol/L — ABNORMAL LOW (ref 22–32)
Calcium: 8.5 mg/dL — ABNORMAL LOW (ref 8.9–10.3)
Calcium: 7.7 mg/dL — ABNORMAL LOW (ref 8.9–10.3)
Chloride: 105 mmol/L (ref 98–111)
Chloride: 111 mmol/L (ref 98–111)
Creatinine, Ser: 1.16 mg/dL — ABNORMAL HIGH (ref 0.44–1.00)
Creatinine, Ser: 1.25 mg/dL — ABNORMAL HIGH (ref 0.44–1.00)
GFR calc Af Amer: 58 mL/min — ABNORMAL LOW (ref >60)
GFR calc Af Amer: 53 mL/min — ABNORMAL LOW (ref >60)
GFR calc non Af Amer: 50 mL/min — ABNORMAL LOW (ref >60)
GFR calc non Af Amer: 46 mL/min — ABNORMAL LOW (ref >60)
Glucose, Bld: 222 mg/dL — ABNORMAL HIGH (ref 70–99)
Glucose, Bld: 98 mg/dL (ref 70–99)
Potassium: 4.7 mmol/L (ref 3.5–5.1)
Potassium: 5.2 mmol/L — ABNORMAL HIGH (ref 3.5–5.1)
Sodium: 139 mmol/L (ref 135–145)
Sodium: 141 mmol/L (ref 135–145)
Total Bilirubin: 0.8 mg/dL (ref 0.3–1.2)
Total Bilirubin: 1.4 mg/dL — ABNORMAL HIGH (ref 0.3–1.2)
Total Protein: 6.6 g/dL (ref 6.5–8.1)
Total Protein: 5.3 g/dL — ABNORMAL LOW (ref 6.5–8.1)

## 2019-11-24 LAB — CBC WITH DIFFERENTIAL/PLATELET
Abs Immature Granulocytes: 1.14 10*3/uL — ABNORMAL HIGH (ref 0.00–0.07)
Basophils Absolute: 0.1 10*3/uL (ref 0.0–0.1)
Basophils Relative: 1 %
Eosinophils Absolute: 0.2 10*3/uL (ref 0.0–0.5)
Eosinophils Relative: 1 %
HCT: 51 % — ABNORMAL HIGH (ref 36.0–46.0)
Hemoglobin: 15.9 g/dL — ABNORMAL HIGH (ref 12.0–15.0)
Immature Granulocytes: 6 %
Lymphocytes Relative: 35 %
Lymphs Abs: 7 10*3/uL — ABNORMAL HIGH (ref 0.7–4.0)
MCH: 32.4 pg (ref 26.0–34.0)
MCHC: 31.2 g/dL (ref 30.0–36.0)
MCV: 104.1 fL — ABNORMAL HIGH (ref 80.0–100.0)
Monocytes Absolute: 0.9 10*3/uL (ref 0.1–1.0)
Monocytes Relative: 4 %
Neutro Abs: 10.6 10*3/uL — ABNORMAL HIGH (ref 1.7–7.7)
Neutrophils Relative %: 53 %
Platelets: 177 10*3/uL (ref 150–400)
RBC: 4.9 MIL/uL (ref 3.87–5.11)
RDW: 14.1 % (ref 11.5–15.5)
WBC: 19.9 10*3/uL — ABNORMAL HIGH (ref 4.0–10.5)
nRBC: 0.2 % (ref 0.0–0.2)

## 2019-11-24 LAB — MAGNESIUM
Magnesium: 2.2 mg/dL (ref 1.7–2.4)
Magnesium: 1.8 mg/dL (ref 1.7–2.4)

## 2019-11-24 LAB — TROPONIN I (HIGH SENSITIVITY)
Troponin I (High Sensitivity): 102 ng/L (ref <18)
Troponin I (High Sensitivity): 865 ng/L (ref <18)

## 2019-11-24 LAB — CBG MONITORING, ED: Glucose-Capillary: 180 mg/dL — ABNORMAL HIGH (ref 70–99)

## 2019-11-24 LAB — RESPIRATORY PANEL BY RT PCR (FLU A&B, COVID)
Influenza A by PCR: NEGATIVE
Influenza B by PCR: NEGATIVE
SARS Coronavirus 2 by RT PCR: NEGATIVE

## 2019-11-24 LAB — I-STAT CHEM 8, ED
BUN: 19 mg/dL (ref 8–23)
Calcium, Ion: 1.12 mmol/L — ABNORMAL LOW (ref 1.15–1.40)
Chloride: 105 mmol/L (ref 98–111)
Creatinine, Ser: 0.8 mg/dL (ref 0.44–1.00)
Glucose, Bld: 214 mg/dL — ABNORMAL HIGH (ref 70–99)
HCT: 53 % — ABNORMAL HIGH (ref 36.0–46.0)
Hemoglobin: 18 g/dL — ABNORMAL HIGH (ref 12.0–15.0)
Potassium: 4.3 mmol/L (ref 3.5–5.1)
Sodium: 140 mmol/L (ref 135–145)
TCO2: 25 mmol/L (ref 22–32)

## 2019-11-24 LAB — HIV ANTIBODY (ROUTINE TESTING W REFLEX): HIV Screen 4th Generation wRfx: NONREACTIVE

## 2019-11-24 LAB — RESPIRATORY PANEL BY PCR

## 2019-11-24 LAB — APTT: aPTT: 37 seconds — ABNORMAL HIGH (ref 24–36)

## 2019-11-24 LAB — STREP PNEUMONIAE URINARY ANTIGEN: Strep Pneumo Urinary Antigen: POSITIVE — AB

## 2019-11-24 LAB — LACTIC ACID, PLASMA
Lactic Acid, Venous: 6.8 mmol/L (ref 0.5–1.9)
Lactic Acid, Venous: 3.1 mmol/L (ref 0.5–1.9)

## 2019-11-24 LAB — RAPID URINE DRUG SCREEN, HOSP PERFORMED
Amphetamines: NOT DETECTED
Barbiturates: NOT DETECTED
Benzodiazepines: NOT DETECTED
Cocaine: NOT DETECTED
Opiates: NOT DETECTED
Tetrahydrocannabinol: NOT DETECTED

## 2019-11-24 LAB — SARS CORONAVIRUS 2 (TAT 6-24 HRS): SARS Coronavirus 2: NEGATIVE

## 2019-11-24 LAB — TYPE AND SCREEN
ABO/RH(D): O POS
Antibody Screen: NEGATIVE

## 2019-11-24 LAB — PROTIME-INR
INR: 1.2 (ref 0.8–1.2)
Prothrombin Time: 15 seconds (ref 11.4–15.2)

## 2019-11-24 LAB — ETHANOL: Alcohol, Ethyl (B): <10 mg/dL (ref <10)

## 2019-11-24 LAB — MRSA PCR SCREENING: MRSA by PCR: NEGATIVE

## 2019-11-24 LAB — TRIGLYCERIDES: Triglycerides: 52 mg/dL (ref <150)

## 2019-11-24 LAB — ABO/RH: ABO/RH(D): O POS

## 2019-11-24 LAB — PHOSPHORUS: Phosphorus: 4 mg/dL (ref 2.5–4.6)

## 2019-11-24 MED ORDER — VANCOMYCIN HCL 1750 MG/350ML IV SOLN: 1750.0000 mg | Freq: Two times a day (BID) | INTRAVENOUS | Status: DC

## 2019-11-24 MED ORDER — PROPOFOL 1000 MG/100ML IV EMUL
INTRAVENOUS | Status: AC
  Administered 2019-11-24: 10 ug/kg/min via INTRAVENOUS
  Filled 2019-11-24: qty 100

## 2019-11-24 MED ORDER — PROPOFOL 1000 MG/100ML IV EMUL
INTRAVENOUS | Status: AC
  Filled 2019-11-24: qty 100

## 2019-11-24 MED ORDER — FENTANYL CITRATE (PF) 100 MCG/2ML IJ SOLN
50.0000 ug | INTRAMUSCULAR | Status: DC | PRN
  Administered 2019-11-24: 50 ug via INTRAVENOUS
  Filled 2019-11-24: qty 2

## 2019-11-24 MED ORDER — SODIUM CHLORIDE 0.9 % IV SOLN
2.0000 g | Freq: Three times a day (TID) | INTRAVENOUS | Status: DC
  Administered 2019-11-24: 2 g via INTRAVENOUS
  Filled 2019-11-24 (×3): qty 2

## 2019-11-24 MED ORDER — SODIUM CHLORIDE 0.9 % IV SOLN
INTRAVENOUS | Status: DC | PRN
  Administered 2019-11-24: 250 mL via INTRAVENOUS

## 2019-11-24 MED ORDER — NOREPINEPHRINE 4 MG/250ML-% IV SOLN
0.0000 ug/min | INTRAVENOUS | Status: DC
  Administered 2019-11-24: 2 ug/min via INTRAVENOUS
  Administered 2019-11-24: 4 ug/min via INTRAVENOUS
  Filled 2019-11-24: qty 250

## 2019-11-24 MED ORDER — SODIUM CHLORIDE 0.9 % IV SOLN: INTRAVENOUS | Status: DC

## 2019-11-24 MED ORDER — PANTOPRAZOLE SODIUM 40 MG IV SOLR
40.0000 mg | Freq: Every day | INTRAVENOUS | Status: DC
  Administered 2019-11-24: 40 mg via INTRAVENOUS

## 2019-11-24 MED ORDER — IOHEXOL 350 MG/ML SOLN
100.0000 mL | Freq: Once | INTRAVENOUS | Status: AC | PRN
  Administered 2019-11-24: 05:00:00 100 mL via INTRAVENOUS

## 2019-11-24 MED ORDER — PROPOFOL 1000 MG/100ML IV EMUL
5.0000 ug/kg/min | INTRAVENOUS | Status: DC
  Administered 2019-11-24 – 2019-11-25 (×2): 20 ug/kg/min via INTRAVENOUS
  Administered 2019-11-25: 09:00:00 30 ug/kg/min via INTRAVENOUS
  Administered 2019-11-25: 20 ug/kg/min via INTRAVENOUS
  Filled 2019-11-24 (×4): qty 100

## 2019-11-24 MED ORDER — LORAZEPAM 2 MG/ML IJ SOLN
INTRAMUSCULAR | Status: AC
  Filled 2019-11-24: qty 1

## 2019-11-24 MED ORDER — VANCOMYCIN HCL IN DEXTROSE 1-5 GM/200ML-% IV SOLN
1000.0000 mg | Freq: Once | INTRAVENOUS | Status: AC
  Administered 2019-11-24: 03:00:00 1000 mg via INTRAVENOUS
  Filled 2019-11-24: qty 200

## 2019-11-24 MED ORDER — PROPOFOL 1000 MG/100ML IV EMUL
INTRAVENOUS | Status: AC | PRN
  Administered 2019-11-24: 10 ug/kg/min via INTRAVENOUS

## 2019-11-24 MED ORDER — FENTANYL 2500MCG IN NS 250ML (10MCG/ML) PREMIX INFUSION
0.0000 ug/h | INTRAVENOUS | Status: DC
  Administered 2019-11-24: 50 ug/h via INTRAVENOUS
  Filled 2019-11-24 (×2): qty 250

## 2019-11-24 MED ORDER — ORAL CARE MOUTH RINSE
15.0000 mL | OROMUCOSAL | Status: DC
  Administered 2019-11-24 – 2019-11-25 (×11): 15 mL via OROMUCOSAL

## 2019-11-24 MED ORDER — CHLORHEXIDINE GLUCONATE 0.12% ORAL RINSE (MEDLINE KIT)
15.0000 mL | Freq: Two times a day (BID) | OROMUCOSAL | Status: DC
  Administered 2019-11-24 – 2019-11-25 (×3): 15 mL via OROMUCOSAL

## 2019-11-24 MED ORDER — LORAZEPAM 2 MG/ML IJ SOLN
2.0000 mg | INTRAMUSCULAR | Status: DC | PRN
  Administered 2019-11-24 – 2019-11-25 (×2): 2 mg via INTRAVENOUS
  Filled 2019-11-24: qty 1

## 2019-11-24 MED ORDER — FENTANYL CITRATE (PF) 100 MCG/2ML IJ SOLN
50.0000 ug | INTRAMUSCULAR | Status: DC | PRN
  Administered 2019-11-24: 100 ug via INTRAVENOUS
  Filled 2019-11-24: qty 2

## 2019-11-24 MED ORDER — SODIUM CHLORIDE 0.9 % IV BOLUS (SEPSIS)
1000.0000 mL | Freq: Once | INTRAVENOUS | Status: AC
  Administered 2019-11-24: 02:00:00 1000 mL via INTRAVENOUS

## 2019-11-24 MED ORDER — SODIUM CHLORIDE 0.9 % IV BOLUS (SEPSIS)
1000.0000 mL | Freq: Once | INTRAVENOUS | Status: AC
  Administered 2019-11-24: 1000 mL via INTRAVENOUS

## 2019-11-24 MED ORDER — CHLORHEXIDINE GLUCONATE CLOTH 2 % EX PADS: 6.0000 | MEDICATED_PAD | Freq: Every day | CUTANEOUS | Status: DC

## 2019-11-24 MED ORDER — SODIUM CHLORIDE 0.9 % IV SOLN
2.0000 g | Freq: Once | INTRAVENOUS | Status: AC
  Administered 2019-11-24: 2 g via INTRAVENOUS
  Filled 2019-11-24: qty 2

## 2019-11-24 MED ORDER — VANCOMYCIN HCL 1500 MG/300ML IV SOLN
1500.0000 mg | Freq: Once | INTRAVENOUS | Status: DC
  Administered 2019-11-24: 15:00:00 1500 mg via INTRAVENOUS
  Filled 2019-11-24: qty 300

## 2019-11-24 MED ORDER — CHLORHEXIDINE GLUCONATE CLOTH 2 % EX PADS
6.0000 | MEDICATED_PAD | Freq: Every day | CUTANEOUS | Status: DC
  Administered 2019-11-25: 6 via TOPICAL

## 2019-11-24 NOTE — ED Notes (Addendum)
BP switched to L lower leg, bp reading 92/46

## 2019-11-24 NOTE — ED Triage Notes (Addendum)
Pt arrived with EMS as post CPR. Pt found in bathroom by friend with estimated down time of 30 mins from time pt was last known well. Blood noted to mouth, EMS started CPR at [place], rhythm 30-40 in PEA; 15 mins of cpr and epi x 3, with ROSC, rhythm Vtach, defibrillated at 200J, rhythm to ST.EMS reported demand pacing, pacer turned off on arrival. EMS briefly started epi gtt, bp 190/110, epi gtt discontinued. King airway and 18g IV to R AC in place

## 2019-11-24 NOTE — ED Notes (Signed)
Date and time results received:  (use smartphrase ".now" to insert current time)  Test: Lactic  Critical Value: 6.8  Name of Provider Notified: WaRD   Orders Received? Or Actions Taken?: 2nd liter of NS ordered

## 2019-11-24 NOTE — Progress Notes (Signed)
Called by RN, patient is having upper and lower as well pulmonary hemorrhage.  Anoxic injury evident when propofol is decreased.  Saturation dropping on 100% and PEEP of 18.  Patient is actively dying at this point.  Eliseo Gum, NP has been in direct contact with family all day.  Patient is DNR with no further escalation of care.   I spoke with the brother York Cerise and informed him that the patient is actively dying and that it is important that they come in to see her because I am unsure how long we will continue to support her.  He is aware that she may pass before their arrival as they are waiting on a brother that is in [facility] that should be here in two hours at which point we will proceed with comfort measures.  In the meantime, we will not increase anything any further and he expressed understanding.  Will start some fentanyl for comfort with titration.  Will d/c further blood work and medications not related to comfort.  Will titrate levophed down but not back up.  No transfusion.  D/C abx.  Focus more on keeping the patient comfortable.  The patient is critically ill with multiple organ systems failure and requires high complexity decision making for assessment and support, frequent evaluation and titration of therapies, application of advanced monitoring technologies and extensive interpretation of multiple databases.   Critical Care Time devoted to patient care services described in this note is  45  Minutes. This time reflects time of care of this signee Dr Jennet Maduro. This critical care time does not reflect procedure time, or teaching time or supervisory time of PA/NP/Med student/Med Resident etc but could involve care discussion time.  Rush Farmer, M.D. [facility] Pulmonary/Critical Care Medicine.

## 2019-11-24 NOTE — Code Documentation (Signed)
Hematoma and abrasion noted to forehead

## 2019-11-24 NOTE — ED Notes (Signed)
Provider insisted pt go to CT prior to going upstairs.  Provider, respiratory and RN to CT.

## 2019-11-24 NOTE — Progress Notes (Signed)
Nutrition Brief Note  Chart reviewed. Pt currently on comfort measures. No nutrition interventions warranted at this time.  Please re-consult as needed.   Corrin Parker, MS, RD, LDN Pager # [phone] After hours/ weekend pager # [phone]

## 2019-11-24 NOTE — Progress Notes (Signed)
Pharmacy Antibiotic Note  Andrea Carr is a 63 y.o. female admitted on 10/30/2019 s/p cardiac arrest with SAH and with sepsis and pneumonia.  Pharmacy has been consulted to continue Vancomycin and Cefepime dosing.  Note PCN allergy of rash - patient has tolerated Keflex in past as well as Cefepime 2g in ED at ~3AM.  Patient was also given Vancomycin 1000mg  IV x1 in ED at ~3AM.   Patient has leukocytosis and is hypothermic. Cultures are pending.  Chest CT shows significant bilateral airspace consolidation.  BMI >30. SCr down to 0.8 with normalized CrCl ~ 82.8 mL/min.    Plan: Cefepime 2g IV every 8 hours.  Vancomycin 1500 mg IV now (for total load of 2.5 grams this AM), then 1750 mg IV every 12 hours. Estimated AUC 474 SCr used 0.8.  Monitor renal function, culture results, and clinical status/plan.   Height: 5\' 6"  (167.6 cm) Weight: (!) 370 lb (167.8 kg) IBW/kg (Calculated) : 59.3  Temp (24hrs), Avg:93 F (33.9 C), Min:90.9 F (32.7 C), Max:95.7 F (35.4 C)  Recent Labs  Lab 11/17/2019 0100 11/29/2019 0101 11/12/2019 0111  WBC 19.9*  19.9*  --   --   CREATININE 1.16*  --  0.80  LATICACIDVEN  --  6.8*  --     Estimated Creatinine Clearance: 118.2 mL/min (by C-G formula based on SCr of 0.8 mg/dL).    Allergies  Allergen Reactions  . Lisinopril Swelling  . Penicillins     Unknown Has patient had a PCN reaction causing immediate rash, facial/tongue/throat swelling, SOB or lightheadedness with hypotension:YES Has patient had a PCN reaction causing severe rash involving mucus membranes or skin necrosis:NO Has patient had a PCN reaction that required hospitalization NO Has patient had a PCN reaction occurring within the last 10 years: NO If all of the above answers are "NO", then may proceed with Cephalosporin use.    Antimicrobials this admission: Vanc 12/26 >> Cefepime 12/26 >>  Dose adjustments this admission:   Microbiology results: 12/26 Resp panel >> 12/26 COVID  PCR negative; swab >> 12/26 Flu PCR negative 12/26 MRSA PCR >> 12/26 UCx >> 12/26 BCx >>  Thank you for allowing pharmacy to be a part of this patient's care.  Sloan Leiter, PharmD, BCPS, BCCCP Clinical Pharmacist Please refer to [facility] for [place] numbers 11/21/2019 12:04 PM

## 2019-11-24 NOTE — ED Notes (Signed)
RN and tecs attempted to clean pt up again after she had a very loose bowel movement.  As she was being cleaned she continued to have bowel movements.

## 2019-11-24 NOTE — Consult Note (Signed)
Reason for Consult:SAH, left ICA aneurysm Referring Physician: CCM  Andrea Carr is an 63 y.o. female.  HPI: whom was found down at home after 30'', ems called and resuscitated her for PEA for approximately 15 minutes. She was intubated in the field.   No past medical history on file.  Past Surgical History:  Procedure Laterality Date  . ABDOMINAL HYSTERECTOMY    . TONSILLECTOMY      No family history on file.  Social History:  reports that she has been smoking. She has never used smokeless tobacco. She reports that she does not drink alcohol or use drugs.  Allergies:  Allergies  Allergen Reactions  . Lisinopril Swelling  . Penicillins     Unknown Has patient had a PCN reaction causing immediate rash, facial/tongue/throat swelling, SOB or lightheadedness with hypotension:YES Has patient had a PCN reaction causing severe rash involving mucus membranes or skin necrosis:NO Has patient had a PCN reaction that required hospitalization NO Has patient had a PCN reaction occurring within the last 10 years: NO If all of the above answers are "NO", then may proceed with Cephalosporin use.    Medications: I have reviewed the patient's current medications.  Results for orders placed or performed during the hospital encounter of 11/09/2019 (from the past 48 hour(s))  CBC     Status: Abnormal   Collection Time: 11/20/2019  1:00 AM  Result Value Ref Range   WBC 19.9 (H) 4.0 - 10.5 K/uL   RBC 5.03 3.87 - 5.11 MIL/uL   Hemoglobin 16.0 (H) 12.0 - 15.0 g/dL   HCT 11.9 (H) 14.7 - 82.9 %   MCV 104.4 (H) 80.0 - 100.0 fL   MCH 31.8 26.0 - 34.0 pg   MCHC 30.5 30.0 - 36.0 g/dL   RDW 56.2 13.0 - 86.5 %   Platelets 181 150 - 400 K/uL   nRBC 0.2 0.0 - 0.2 %    Comment: Performed at [facility], [address] [place], [place] [address]  APTT     Status: Abnormal   Collection Time: 11/02/2019  1:00 AM  Result Value Ref Range   aPTT 37 (H) 24 - 36 seconds    Comment:        IF BASELINE aPTT  IS ELEVATED, SUGGEST PATIENT RISK ASSESSMENT BE USED TO DETERMINE APPROPRIATE ANTICOAGULANT THERAPY. Performed at [facility], [address], [place], [place] [address]   Protime-INR     Status: None   Collection Time: 11/07/2019  1:00 AM  Result Value Ref Range   Prothrombin Time 15.0 11.4 - 15.2 seconds   INR 1.2 0.8 - 1.2    Comment: (NOTE) INR goal varies based on device and disease states. Performed at [facility], [address] [place], [place] [address]   Comprehensive metabolic panel     Status: Abnormal   Collection Time: 11/17/2019  1:00 AM  Result Value Ref Range   Sodium 139 135 - 145 mmol/L   Potassium 4.7 3.5 - 5.1 mmol/L   Chloride 105 98 - 111 mmol/L   CO2 17 (L) 22 - 32 mmol/L   Glucose, Bld 222 (H) 70 - 99 mg/dL   BUN 16 8 - 23 mg/dL   Creatinine, Ser 4.40 (H) 0.44 - 1.00 mg/dL   Calcium 8.5 (L) 8.9 - 10.3 mg/dL   Total Protein 6.6 6.5 - 8.1 g/dL   Albumin 3.4 (L) 3.5 - 5.0 g/dL   AST 102 (H) 15 - 41  U/L   ALT 115 (H) 0 - 44 U/L   Alkaline Phosphatase 165 (H) 38 - 126 U/L   Total Bilirubin 0.8 0.3 - 1.2 mg/dL   GFR calc non Af Amer 50 (L) >60 mL/min   GFR calc Af Amer 58 (L) >60 mL/min   Anion gap 17 (H) 5 - 15    Comment: Performed at [place] [address] [place], [place] [address]  CBC with Differential/Platelet     Status: Abnormal   Collection Time: 11/21/2019  1:00 AM  Result Value Ref Range   WBC 19.9 (H) 4.0 - 10.5 K/uL   RBC 4.90 3.87 - 5.11 MIL/uL   Hemoglobin 15.9 (H) 12.0 - 15.0 g/dL   HCT 51.0 (H) 36.0 - 46.0 %   MCV 104.1 (H) 80.0 - 100.0 fL   MCH 32.4 26.0 - 34.0 pg   MCHC 31.2 30.0 - 36.0 g/dL   RDW 14.1 11.5 - 15.5 %   Platelets 177 150 - 400 K/uL   nRBC 0.2 0.0 - 0.2 %   Neutrophils Relative % 53 %   Neutro Abs 10.6 (H) 1.7 - 7.7 K/uL   Lymphocytes Relative 35 %   Lymphs Abs 7.0 (H) 0.7 - 4.0 K/uL   Monocytes Relative 4 %   Monocytes Absolute 0.9 0.1 - 1.0 K/uL   Eosinophils Relative 1 %    Eosinophils Absolute 0.2 0.0 - 0.5 K/uL   Basophils Relative 1 %   Basophils Absolute 0.1 0.0 - 0.1 K/uL   Immature Granulocytes 6 %   Abs Immature Granulocytes 1.14 (H) 0.00 - 0.07 K/uL    Comment: Performed at [facility] [address] [place], [place] [address]  Lactic acid, plasma     Status: Abnormal   Collection Time: 11/10/2019  1:01 AM  Result Value Ref Range   Lactic Acid, Venous 6.8 (HH) 0.5 - 1.9 mmol/L    Comment: CRITICAL RESULT CALLED TO, READ BACK BY AND VERIFIED WITH: RN B OLDLAND @0154  11/23/2019 BY S GEZAHEGN Performed at [facility], [place] [address] [place], [place] [address]   I-stat chem 8, ED (not at [facility] or [facility])     Status: Abnormal   Collection Time: 11/07/2019  1:11 AM  Result Value Ref Range   Sodium 140 135 - 145 mmol/L   Potassium 4.3 3.5 - 5.1 mmol/L   Chloride 105 98 - 111 mmol/L   BUN 19 8 - 23 mg/dL   Creatinine, Ser 0.80 0.44 - 1.00 mg/dL   Glucose, Bld 214 (H) 70 - 99 mg/dL   Calcium, Ion 1.12 (L) 1.15 - 1.40 mmol/L   TCO2 25 22 - 32 mmol/L   Hemoglobin 18.0 (H) 12.0 - 15.0 g/dL   HCT 53.0 (H) 36.0 - 46.0 %  Troponin I (High Sensitivity)     Status: Abnormal   Collection Time: 11/08/2019  1:13 AM  Result Value Ref Range   Troponin I (High Sensitivity) 102 (HH) <18 ng/L    Comment: CRITICAL RESULT CALLED TO, READ BACK BY AND VERIFIED WITH: RN B OLDLAND @0229  11/02/2019 BY S GEZAHEGN (NOTE) Elevated high sensitivity troponin I (hsTnI) values and significant  changes across serial measurements may suggest ACS but many other  chronic and acute conditions are known to elevate hsTnI results.  Refer to the Links section for chest pain algorithms and additional  guidance. Performed at [facility], [place] [address] [place], [place] [address]   Magnesium  Status: None   Collection Time: 11/14/2019  1:13 AM  Result Value Ref Range   Magnesium 2.2 1.7 - 2.4 mg/dL    Comment: Performed at [facility], [address]  [place], [place] [address]  Brain natriuretic peptide     Status: Abnormal   Collection Time: 11/07/2019  1:14 AM  Result Value Ref Range   B Natriuretic Peptide 161.7 (H) 0.0 - 100.0 pg/mL    Comment: Performed at [facility], [address] [place], [place] [address]  Ethanol     Status: None   Collection Time: 10/31/2019  1:14 AM  Result Value Ref Range   Alcohol, Ethyl (B) <10 <10 mg/dL    Comment: (NOTE) Lowest detectable limit for serum alcohol is 10 mg/dL. For medical purposes only. Performed at [facility], [address], [place], [place] [address]   CBG monitoring, ED     Status: Abnormal   Collection Time: 11/22/2019  1:17 AM  Result Value Ref Range   Glucose-Capillary 180 (H) 70 - 99 mg/dL  Respiratory Panel by RT PCR (Flu A&B, Covid) - Nasopharyngeal Swab     Status: None   Collection Time: 10/30/2019  1:19 AM   Specimen: Nasopharyngeal Swab  Result Value Ref Range   SARS Coronavirus 2 by RT PCR NEGATIVE NEGATIVE    Comment: (NOTE) SARS-CoV-2 target nucleic acids are NOT DETECTED. The SARS-CoV-2 RNA is generally detectable in upper respiratoy specimens during the acute phase of infection. The lowest concentration of SARS-CoV-2 viral copies this assay can detect is 131 copies/mL. A negative result does not preclude SARS-Cov-2 infection and should not be used as the sole basis for treatment or other patient management decisions. A negative result may occur with  improper specimen collection/handling, submission of specimen other than nasopharyngeal swab, presence of viral mutation(s) within the areas targeted by this assay, and inadequate number of viral copies (<131 copies/mL). A negative result must be combined with clinical observations, patient history, and epidemiological information. The expected result is Negative. Fact Sheet for Patients:  https://www.moore.com/ Fact Sheet for Healthcare Providers:   https://www.young.biz/ This test is not yet ap proved or cleared by the [place] FDA and  has been authorized for detection and/or diagnosis of SARS-CoV-2 by FDA under an Emergency Use Authorization (EUA). This EUA will remain  in effect (meaning this test can be used) for the duration of the COVID-19 declaration under Section 564(b)(1) of the Act, 21 U.S.C. section 360bbb-3(b)(1), unless the authorization is terminated or revoked sooner.    Influenza A by PCR NEGATIVE NEGATIVE   Influenza B by PCR NEGATIVE NEGATIVE    Comment: (NOTE) The Xpert Xpress SARS-CoV-2/FLU/RSV assay is intended as an aid in  the diagnosis of influenza from Nasopharyngeal swab specimens and  should not be used as a sole basis for treatment. Nasal washings and  aspirates are unacceptable for Xpert Xpress SARS-CoV-2/FLU/RSV  testing. Fact Sheet for Patients: https://www.moore.com/ Fact Sheet for Healthcare Providers: https://www.young.biz/ This test is not yet approved or cleared by the [place] FDA and  has been authorized for detection and/or diagnosis of SARS-CoV-2 by  FDA under an Emergency Use Authorization (EUA). This EUA will remain  in effect (meaning this test can be used) for the duration of the  Covid-19 declaration under Section 564(b)(1) of the Act, 21  U.S.C. section 360bbb-3(b)(1), unless the authorization is  terminated or revoked. Performed at [facility], [address] [place], [place]  [address]   Urinalysis, Routine w reflex microscopic     Status: Abnormal   Collection Time: November 27, 2019  1:33 AM  Result Value Ref Range   Color, Urine YELLOW YELLOW   APPearance HAZY (A) CLEAR   Specific Gravity, Urine 1.023 1.005 - 1.030   pH 6.0 5.0 - 8.0   Glucose, UA 50 (A) NEGATIVE mg/dL   Hgb urine dipstick MODERATE (A) NEGATIVE   Bilirubin Urine NEGATIVE NEGATIVE   Ketones, ur NEGATIVE NEGATIVE mg/dL   Protein, ur 604  (A) NEGATIVE mg/dL   Nitrite NEGATIVE NEGATIVE   Leukocytes,Ua NEGATIVE NEGATIVE   RBC / HPF >50 (H) 0 - 5 RBC/hpf   WBC, UA 21-50 0 - 5 WBC/hpf   Bacteria, UA RARE (A) NONE SEEN   Squamous Epithelial / LPF 0-5 0 - 5   Mucus PRESENT     Comment: Performed at [facility], [address] [place], [place] [address]  Urine rapid drug screen (hosp performed)not at [facility]     Status: None   Collection Time: 11-27-2019  1:33 AM  Result Value Ref Range   Opiates NONE DETECTED NONE DETECTED   Cocaine NONE DETECTED NONE DETECTED   Benzodiazepines NONE DETECTED NONE DETECTED   Amphetamines NONE DETECTED NONE DETECTED   Tetrahydrocannabinol NONE DETECTED NONE DETECTED   Barbiturates NONE DETECTED NONE DETECTED    Comment: (NOTE) DRUG SCREEN FOR MEDICAL PURPOSES ONLY.  IF CONFIRMATION IS NEEDED FOR ANY PURPOSE, NOTIFY LAB WITHIN 5 DAYS. LOWEST DETECTABLE LIMITS FOR URINE DRUG SCREEN Drug Class                     Cutoff (ng/mL) Amphetamine and metabolites    1000 Barbiturate and metabolites    200 Benzodiazepine                 200 Tricyclics and metabolites     300 Opiates and metabolites        300 Cocaine and metabolites        300 THC                            50 Performed at [facility], [address] [place], [place] [address]   I-STAT 7, (LYTES, BLD GAS, ICA, H+H)     Status: Abnormal   Collection Time: November 27, 2019  5:16 AM  Result Value Ref Range   pH, Arterial 7.286 (L) 7.350 - 7.450   pCO2 arterial 47.6 32.0 - 48.0 mmHg   pO2, Arterial 44.0 (L) 83.0 - 108.0 mmHg   Bicarbonate 24.0 20.0 - 28.0 mmol/L   TCO2 26 22 - 32 mmol/L   O2 Saturation 84.0 %   Acid-base deficit 5.0 (H) 0.0 - 2.0 mmol/L   Sodium 139 135 - 145 mmol/L   Potassium 4.0 3.5 - 5.1 mmol/L   Calcium, Ion 1.14 (L) 1.15 - 1.40 mmol/L   HCT 50.0 (H) 36.0 - 46.0 %   Hemoglobin 17.0 (H) 12.0 - 15.0 g/dL   Patient temperature 78.2 C    Collection site RADIAL, ALLEN'S TEST ACCEPTABLE    Drawn by RT     Sample type ARTERIAL     CT Angio Head W or Wo Contrast  Result Date: Nov 27, 2019 CLINICAL DATA:  Subarachnoid hemorrhage follow up EXAM: CT ANGIOGRAPHY HEAD TECHNIQUE: Multidetector CT imaging of the head was performed using the standard protocol during bolus administration of intravenous contrast. Multiplanar CT image  reconstructions and MIPs were obtained to evaluate the vascular anatomy. CONTRAST:  100mL OMNIPAQUE IOHEXOL 350 MG/ML SOLN COMPARISON:  None. FINDINGS: POSTERIOR CIRCULATION: --Vertebral arteries: Normal V4 segments. --Posterior inferior cerebellar arteries (PICA): Patent origins from the vertebral arteries. --Anterior inferior cerebellar arteries (AICA): Patent origins from the basilar artery. --Basilar artery: Distal fenestration. --Superior cerebellar arteries: Normal. --Posterior cerebral arteries: Normal. There are bilateral posterior communicating arteries (p-comm) that partially supply the PCAs. 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 There is a triangular outpouching projecting superiorly. --Anterior cerebral arteries (ACA): Normal. Both A1 segments are present. Patent anterior communicating artery (a-comm). --Middle cerebral arteries (MCA): Normal. Venous sinuses: As permitted by contrast timing, patent. Anatomic variants: None There is redemonstration of diffuse subarachnoid hemorrhage, less clearly characterized in the presence of intravenous contrast material. 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 Electronically Signed   By: Deatra RobinsonKevin  Herman M.D.   On: 01/16/19 05:38   DG Abd 1 View  Result Date: 01/16/19 CLINICAL DATA:  OG placement, post CPR EXAM: ABDOMEN - 1 VIEW COMPARISON:  None. FINDINGS: Transesophageal tube tip is  positioned at the level of the gastric fundus. The side port is positioned at the GE junction. Gaseous distention of transverse colon is noted. Few air-filled but nondistended loops of small bowel are noted in the mid abdomen. Pacer pads overlie the chest. Soft tissue mineralization projects over the left abdominal wall. IMPRESSION: 1. Transesophageal tube tip is positioned at the level of the gastric fundus. The side port is at the GE junction. Could consider advancing 3-5 cm for optimal function. Electronically Signed   By: Kreg ShropshirePrice  DeHay M.D.   On: 01/16/19 01:32   CT Head Wo Contrast  Result Date: 01/16/19 CLINICAL DATA:  Facial trauma found down EXAM: CT HEAD WITHOUT CONTRAST TECHNIQUE: Contiguous axial images were obtained from the base of the skull through the vertex without intravenous contrast. COMPARISON:  None. FINDINGS: Brain: There is diffuse subarachnoid hemorrhage seen throughout the suprasellar cistern, interhemispheric cistern, sylvian cistern, and basilar cisterns with peripheral extension. No intraventricular or extra-axial collections are seen. There is diffuse edema seen throughout the cerebral hemispheres. However no downward herniation is noted. Vascular: No unexpected calcifications seen. Skull: The skull is intact. No fracture or focal lesion identified. Sinuses/Orbits: The visualized paranasal sinuses and mastoid air cells are clear. The orbits and globes intact. Other: Soft tissue hematoma seen overlying the right frontal skull. Endotracheal tube is seen. Face: Osseous: No acute fracture or other significant osseous abnormality.The nasal bone, mandibles, zygomatic arches and pterygoid plates are intact. Orbits: No fracture identified. Unremarkable appearance of globes and orbits. Sinuses: The visualized paranasal sinuses and mastoid air cells are unremarkable. Soft tissues: Soft tissue hematoma seen overlying the right frontal skull. Cervical spine: Alignment: Straightening of the  normal cervical lordosis. Skull base and vertebrae: Visualized skull base is intact. No atlanto-occipital dissociation. The vertebral body heights are well maintained. No fracture or pathologic osseous lesion seen. Soft tissues and spinal canal: The visualized paraspinal soft tissues are unremarkable. No prevertebral soft tissue swelling is seen. The spinal canal is grossly unremarkable, no large epidural collection or significant canal narrowing. Disc levels:  No significant canal or neural foraminal narrowing. Upper chest: Multifocal patchy dense areas of consolidation seen throughout both lungs, with dense areas of consolidation  in the posterior right upper lung. Other: None IMPRESSION: 1. Diffuse extensive subarachnoid hemorrhage throughout the cisterns with peripheral extension. Diffuse cerebral edema without downward herniation. 2. Soft tissue hematoma overlying the right frontal skull. 3. No facial fracture. 4.  No acute fracture or malalignment of the spine. 5. Extensive partially visualized multifocal patchy airspace opacities likely due to multifocal pneumonia and/or ARDS. 6. These critical results were called by telephone at the time of interpretation on 2019/12/03 at 2:47 am to provider [facility] , who verbally acknowledged these results. Electronically Signed   By: Jonna Clark M.D.   On: 12/03/2019 02:50   CT Angio Chest PE W and/or Wo Contrast  Result Date: Dec 03, 2019 CLINICAL DATA:  Shortness of breath. Cardiac arrest post CPR. Evaluate for pulmonary embolism. EXAM: CT ANGIOGRAPHY CHEST WITH CONTRAST TECHNIQUE: Multidetector CT imaging of the chest was performed using the standard protocol during bolus administration of intravenous contrast. Multiplanar CT image reconstructions and MIPs were obtained to evaluate the vascular anatomy. CONTRAST:  OMNIPAQUE IOHEXOL 350 MG/ML SOLN COMPARISON:  None. FINDINGS: Cardiovascular: Mild cardiomegaly. Thoracic aorta is normal in caliber and  otherwise unremarkable. Pulmonary arterial system is well opacified without evidence of emboli. Remaining vascular structures are unremarkable. Mediastinum/Nodes: No mediastinal or hilar adenopathy. Remaining mediastinal structures are unremarkable. Lungs/Pleura: Endotracheal tube has tip 3.4 cm above the carina. There is significant consolidation throughout both lungs right worse than left. This consolidation is fairly dense over the posterior lower lobes and right upper lobe. No evidence of effusion. Airways otherwise unremarkable. Upper Abdomen: Nasogastric tube has tip over the stomach. Possible tiny nonobstructing right renal stones. Musculoskeletal: Mild degenerative change of the spine. Several bilateral anterior acute rib fractures left worse than right involving left ribs 2 through 8 and right ribs 4 through 6. Review of the MIP images confirms the above findings. IMPRESSION: 1.  No evidence of pulmonary embolism. 2. Significant bilateral airspace consolidation right worse than left which may be due to lobar collapse/atelectasis versus multifocal infection. No effusion. 3. Multiple bilateral acute anterior rib fractures left worse than right as described. 4.  Tubes and lines as described. 5.  Possible tiny nonobstructing right renal stones. Electronically Signed   By: Elberta Fortis M.D.   On: 12-03-19 05:39   CT Cervical Spine Wo Contrast  Result Date: 12/03/2019 CLINICAL DATA:  Facial trauma found down EXAM: CT HEAD WITHOUT CONTRAST TECHNIQUE: Contiguous axial images were obtained from the base of the skull through the vertex without intravenous contrast. COMPARISON:  None. FINDINGS: Brain: There is diffuse subarachnoid hemorrhage seen throughout the suprasellar cistern, interhemispheric cistern, sylvian cistern, and basilar cisterns with peripheral extension. No intraventricular or extra-axial collections are seen. There is diffuse edema seen throughout the cerebral hemispheres. However no downward  herniation is noted. Vascular: No unexpected calcifications seen. Skull: The skull is intact. No fracture or focal lesion identified. Sinuses/Orbits: The visualized paranasal sinuses and mastoid air cells are clear. The orbits and globes intact. Other: Soft tissue hematoma seen overlying the right frontal skull. Endotracheal tube is seen. Face: Osseous: No acute fracture or other significant osseous abnormality.The nasal bone, mandibles, zygomatic arches and pterygoid plates are intact. Orbits: No fracture identified. Unremarkable appearance of globes and orbits. Sinuses: The visualized paranasal sinuses and mastoid air cells are unremarkable. Soft tissues: Soft tissue hematoma seen overlying the right frontal skull. Cervical spine: Alignment: Straightening of the normal cervical lordosis. Skull base and vertebrae: Visualized skull base is intact. No atlanto-occipital dissociation. The vertebral body  heights are well maintained. No fracture or pathologic osseous lesion seen. Soft tissues and spinal canal: The visualized paraspinal soft tissues are unremarkable. No prevertebral soft tissue swelling is seen. The spinal canal is grossly unremarkable, no large epidural collection or significant canal narrowing. Disc levels:  No significant canal or neural foraminal narrowing. Upper chest: Multifocal patchy dense areas of consolidation seen throughout both lungs, with dense areas of consolidation in the posterior right upper lung. Other: None IMPRESSION: 1. Diffuse extensive subarachnoid hemorrhage throughout the cisterns with peripheral extension. Diffuse cerebral edema without downward herniation. 2. Soft tissue hematoma overlying the right frontal skull. 3. No facial fracture. 4.  No acute fracture or malalignment of the spine. 5. Extensive partially visualized multifocal patchy airspace opacities likely due to multifocal pneumonia and/or ARDS. 6. These critical results were called by telephone at the time of  interpretation on 11/02/2019 at 2:47 am to provider [facility] , who verbally acknowledged these results. Electronically Signed   By: Jonna Clark M.D.   On: 11/18/2019 02:50   DG Chest Port 1 View  Result Date: 11/10/2019 CLINICAL DATA:  Post CPR EXAM: PORTABLE CHEST 1 VIEW COMPARISON:  November 17, 2008 FINDINGS: There is marked cardiomegaly. Fluffy/patchy airspace opacities are seen throughout both lungs. ETT is 2.4 cm above the carina. NG tube is seen coursing below the diaphragm. No acute osseous abnormality. IMPRESSION: Diffuse airspace opacities throughout both lungs which could be due to pulmonary edema, ARDS, and/or multifocal pneumonia. ET tube and NG tube in satisfactory position Electronically Signed   By: Jonna Clark M.D.   On: 11/01/2019 01:33   CT Maxillofacial Wo Contrast  Result Date: 11/11/2019 CLINICAL DATA:  Facial trauma found down EXAM: CT HEAD WITHOUT CONTRAST TECHNIQUE: Contiguous axial images were obtained from the base of the skull through the vertex without intravenous contrast. COMPARISON:  None. FINDINGS: Brain: There is diffuse subarachnoid hemorrhage seen throughout the suprasellar cistern, interhemispheric cistern, sylvian cistern, and basilar cisterns with peripheral extension. No intraventricular or extra-axial collections are seen. There is diffuse edema seen throughout the cerebral hemispheres. However no downward herniation is noted. Vascular: No unexpected calcifications seen. Skull: The skull is intact. No fracture or focal lesion identified. Sinuses/Orbits: The visualized paranasal sinuses and mastoid air cells are clear. The orbits and globes intact. Other: Soft tissue hematoma seen overlying the right frontal skull. Endotracheal tube is seen. Face: Osseous: No acute fracture or other significant osseous abnormality.The nasal bone, mandibles, zygomatic arches and pterygoid plates are intact. Orbits: No fracture identified. Unremarkable appearance of globes and  orbits. Sinuses: The visualized paranasal sinuses and mastoid air cells are unremarkable. Soft tissues: Soft tissue hematoma seen overlying the right frontal skull. Cervical spine: Alignment: Straightening of the normal cervical lordosis. Skull base and vertebrae: Visualized skull base is intact. No atlanto-occipital dissociation. The vertebral body heights are well maintained. No fracture or pathologic osseous lesion seen. Soft tissues and spinal canal: The visualized paraspinal soft tissues are unremarkable. No prevertebral soft tissue swelling is seen. The spinal canal is grossly unremarkable, no large epidural collection or significant canal narrowing. Disc levels:  No significant canal or neural foraminal narrowing. Upper chest: Multifocal patchy dense areas of consolidation seen throughout both lungs, with dense areas of consolidation in the posterior right upper lung. Other: None IMPRESSION: 1. Diffuse extensive subarachnoid hemorrhage throughout the cisterns with peripheral extension. Diffuse cerebral edema without downward herniation. 2. Soft tissue hematoma overlying the right frontal skull. 3. No facial fracture. 4.  No acute  fracture or malalignment of the spine. 5. Extensive partially visualized multifocal patchy airspace opacities likely due to multifocal pneumonia and/or ARDS. 6. These critical results were called by telephone at the time of interpretation on 11/05/2019 at 2:47 am to provider [facility] , who verbally acknowledged these results. Electronically Signed   By: Jonna Clark M.D.   On: November 25, 2019 02:50    Review of Systems  Unable to perform ROS: Patient unresponsive   Blood pressure (!) 112/95, pulse (!) 48, temperature (!) 90.9 F (32.7 C), temperature source Bladder, resp. rate 19, weight (!) 167.8 kg, SpO2 (!) 88 %. Physical Exam  Constitutional:  Morbidly obese  HENT:  Head: Normocephalic and atraumatic.  Eyes:  Pupils non reactive , 2mm  Cardiovascular:  External  pacer in place  Respiratory:  ventilated  Neurological: She is unresponsive. GCS eye subscore is 1. GCS verbal subscore is 1. GCS motor subscore is 1.  Comatose Not responsive No pupillary reaction, no corneals No cough, no gag No response to central or peripheral noxious stimuli    Assessment/Plan: Jakhiya Brower is a 63 y.o. 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 As a result she lost consciousness and arrested. Ms. 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 She is critically ill, is too unstable to consider coiling or any other primary treatment for the aneurysm. I believe the anoxic injury is the overwhelming problem. I do not believe this is a survivable injury. She continues to overbreathe the ventilar, is hypothermic, and remains a full code. I have spoken with Mr. Ozella Rocks, a brother and explained the grim prognosis, and the reason why the prognosis is grim. She is also dealing with a primary pnuemonia. No seizure prophylaxis is necessary.   Coletta Memos 11/06/2019, 5:49 AM

## 2019-11-24 NOTE — Progress Notes (Signed)
While cleaning patient, bright red bloody stools noted in flexi tube and OGT appears to be suctioning stool. Notified Eliseo Gum, NP and Dr. Nelda Marseille. Providers came to bedside and called family to update.

## 2019-11-24 NOTE — ED Provider Notes (Addendum)
CHIEF COMPLAINT: Cardiac arrest  HPI: Patient is a 63 year old female with unknown past medical history who presents to the emergency department with EMS after asked.  Per family patient went to the bathroom and approximately 30 minutes later they found her on the bathroom floor with blood coming out of her mouth and nose.  She was unresponsive.  On EMS arrival, patient was pulseless and apneic.  She was in PEA with a rate in the 30s per EMS.  They performed CPR for approximately 15 minutes and gave 3 epinephrines.  She did have one episode of ventricular tachycardia and was defibrillated once.  They had return of spontaneous circulation at 12:20 AM.  Report patient initially in a sinus tachycardia and then became bradycardic into the 50s.  They began pacing her and started on epi drip.  Blood glucose in the 130s.  King airway placed at the scene.  Report limited information from family as family member at home has mental retardation.  ROS: Level 5 caveat secondary to cardiac arrest  PAST MEDICAL HISTORY/PAST SURGICAL HISTORY:  No past medical history on file.  MEDICATIONS:  Prior to Admission medications   Medication Sig Start Date End Date Taking? Authorizing Provider  acetaminophen (TYLENOL) 500 MG tablet Take 500 mg by mouth every 6 (six) hours as needed for mild pain.    [provider]  cephALEXin (KEFLEX) 500 MG capsule Take 1 capsule (500 mg total) by mouth 4 (four) times daily. 05/05/16   Ward, Layla Maw, DO  hydrocortisone cream 1 % Apply to affected area 2 times daily 11/03/17   Hedges, Tinnie Gens, PA-C  loratadine (CLARITIN) 10 MG tablet Take 1 tablet (10 mg total) by mouth daily. 11/03/17   Hedges, Tinnie Gens, PA-C  ranitidine (ZANTAC) 150 MG capsule Take 150 mg by mouth daily as needed for heartburn.    [provider]    ALLERGIES:  Allergies  Allergen Reactions  . Lisinopril Swelling  . Penicillins     Unknown Has patient had a PCN reaction causing immediate rash,  facial/tongue/throat swelling, SOB or lightheadedness with hypotension:YES Has patient had a PCN reaction causing severe rash involving mucus membranes or skin necrosis:NO Has patient had a PCN reaction that required hospitalization NO Has patient had a PCN reaction occurring within the last 10 years: NO If all of the above answers are "NO", then may proceed with Cephalosporin use.    SOCIAL HISTORY:  Social History   Tobacco Use  . Smoking status: Current Some Day Smoker  . Smokeless tobacco: Never Used  Substance Use Topics  . Alcohol use: No    FAMILY HISTORY: No family history on file.  EXAM: BP (!) 85/59   Pulse 65   Temp (!) 94.7 F (34.8 C)   Resp 17   Wt (!) 167.8 kg   SpO2 95%   BMI 59.72 kg/m  CONSTITUTIONAL: Morbidly obese, GCS 3.  Patient breathing intermittently on her own. HEAD: Normocephalic, hematoma to the forehead EYES: Pupils approximately 4 mm bilaterally and minimally reactive, no corneal reflex ENT: Patient has blood coming from bilateral nares, appears to have missing teeth versus poor dentition without dental fragments noted, blood coming from her mouth without appreciable laceration, King airway in place NECK:, No step-off or deformity noted CARD: RRR; S1 and S2 appreciated; no murmurs, no clicks, no rubs, no gallops CHEST: Abrasion to the lower right chest, no flail chest, no ecchymosis RESP: Coarse but equal breath sounds bilaterally.  King airway in place.  [facility]  [facility] but is breathing intermittently on her own.  No wheezing. ABD/GI: Obese abdomen, soft, no ecchymosis BACK:  The back appears normal, no step-off or deformity noted EXT:  SKIN: Skin is cool to touch NEURO: GCS 3, no cough or gag, no corneal reflex, is breathing on her own intermittently   MEDICAL DECISION MAKING: Patient here with unwitnessed cardiac arrest at home.  Initially in PEA then one episode of V. tach then sinus tachycardia after 15  minutes of CPR with ROSC then sinus bradycardia being paced.  On arrival to the ED heart rate in the 70s and we were able to stop pacing the patient.  She was hypertensive.  Breathing on her own intermittently.  King airway in place which was replaced with a 7.5 endotracheal tube.  Signs of head and facial trauma on exam.  Chest x-ray appears volume overloaded versus multifocal pneumonia.  Will give broad-spectrum antibiotics.  We will start cooling patient.  Suspect arrest secondary to respiratory failure.  ED PROGRESS: Patient had drop in blood pressure after propofol sedation.  Propofol stopped but continued to be hypotensive.  Will give IV fluids.  Patient is fluid responsive.  Blood pressure slowly improving.  Discussed with Dr. Warrick Parisiangan on call for E link.  CCM to see patient.  He requested CTA of the chest to evaluate for PE.   Patient has large amount of subarachnoid blood likely from ruptured aneurysm.  Will discuss with neurosurgery on-call.  Will elevate head of bed to 30 degrees.  Patient continues to have hypotension.  Will start Levophed.  Will update critical care team.  CT face and cervical spine show no acute abnormality.   2:55 AM  Spoke with Dr. Franky Machoabbell on-call for neurosurgery.  Appreciate his help.  He recommends obtaining CTA to evaluate for aneurysm.   3:07 Attempted to get in contact with patient's family.  Left message at [phone] for patient's niece Raylene MiyamotoLelia Harris and also left message on patient's home number [phone].  No family has arrived in the ED.  3:19 AM  CCM team at bedside.  Appreciate their help.  They will contact patient's sister-in-law.  Patient's oxygen saturations decreasing.  Had to increase her FiO2 back to 100% and increase her PEEP.  Nursing staff aware of need for CTA of the head and chest.  Patient has extremely poor prognosis.  She is not on sedation at this time and has no neurologic response and now is no longer breathing over the vent.  Doing well  on Levophed.  MAPs over 65 and systolic pressure in the 100s.  Critical care to admit patient.  They are aware that Dr. Franky Machoabbell would like to be contacted when CTA of the head is complete.   I reviewed all nursing notes and pertinent previous records as available.  I have interpreted any EKGs, lab and urine results, imaging (as available).   Procedure Name: Intubation Date/Time: 05/23/2019 1:00 AM Performed by: Ward, Layla MawKristen N, DO Pre-anesthesia Checklist: Emergency Drugs available, Suction available and Patient being monitored Oxygen Delivery Method: Ambu bag Preoxygenation: Pre-oxygenation with 100% oxygen ([facility]) Laryngoscope Size: Glidescope and 3 Grade View: Grade II Tube size: 7.5 mm Number of attempts: 1 Intubation method: Patient positioned with towel roll between the shoulder blades. Placement Confirmation: ETT inserted through vocal cords under direct vision,  Breath sounds checked- equal and bilateral and CO2 detector Secured at: 26 cm Tube secured with: ETT holder  EKG Interpretation  Date/Time:  Saturday November 24 2019 03:31:09 EST Ventricular Rate:  47 PR Interval:    QRS Duration: 117 QT Interval:  552 QTC Calculation: 489 R Axis:   58 Text Interpretation: Sinus bradycardia Incomplete left bundle branch block Confirmed by Ward, Cyril Mourning [phone]) on 11/04/2019 3:38:43 AM        CRITICAL CARE Performed by: Cyril Mourning Ward   Total critical care time: 65 minutes  Critical care time was exclusive of separately billable procedures and treating other patients.  Critical care was necessary to treat or prevent imminent or life-threatening deterioration.  Critical care was time spent personally by me on the following activities: development of treatment plan with patient and/or surrogate as well as nursing, discussions with consultants, evaluation of patient's response to treatment, examination of patient, obtaining history  from patient or surrogate, ordering and performing treatments and interventions, ordering and review of laboratory studies, ordering and review of radiographic studies, pulse oximetry and re-evaluation of patient's condition.   Vinnie Bobst was evaluated in Emergency Department on 11/23/2019 for the symptoms described in the history of present illness. She was evaluated in the context of the global COVID-19 pandemic, which necessitated consideration that the patient might be at risk for infection with the SARS-CoV-2 virus that causes COVID-19. Institutional protocols and algorithms that pertain to the evaluation of patients at risk for COVID-19 are in a state of rapid change based on information released by regulatory bodies including the CDC and federal and state organizations. These policies and algorithms were followed during the patient's care in the ED.  Patient was seen wearing N95, face shield, gloves, gown.    Ward, Delice Bison, DO 11/27/2019 0326    Ward, Delice Bison, DO 10/31/2019 [phone]

## 2019-11-24 NOTE — ED Notes (Signed)
[phone] Ronita Hipps pts sister-in-law (will relay to other family members) pt status

## 2019-11-24 NOTE — Progress Notes (Signed)
Chaplain visited to provide spiritual support for the family.  The two brothers at bedside wanted prayer and the chaplain prayed with them.  The on-call chaplain is available if further support is needed.  Brion Aliment Chaplain Resident For questions concerning this note please contact me by pager [phone]

## 2019-11-24 NOTE — Plan of Care (Signed)
PCCM Plan of Care/ Communication Note   Patient has continued to decompensate throughout the day. She remains comatose. She remains on pressors. She remains on 100% FiO2 and PEEP 18, with poor oxygenation. Frank blood is suctioned from her ETT. She is exhibiting signs of myoclonus and was started on propofol for this.   I have been in close communication with brothers Roselyn Reef and Legrand Como throughout the day. The patient is DNR and no escalation of care. The family is likely transitioning to comfort care when Legrand Como arrives to [facility] from [place] this evening. I have tried to clarify visitation policies as it pertains to this situation. Family understands that the patient's status is very guarded and she may pass away in interim despite current level of interventions.   P -Follow up with family after arrival in town for [place]. Likely transition to [place].  -Remains DNR with no escalation     Eliseo Gum MSN, AGACNP-BC [place] [phone] If no answer, [phone] 12-22-19, 3:21 PM

## 2019-11-24 NOTE — Progress Notes (Signed)
Urine output low. Notified G. Bowser, NP. No new orders at the time.

## 2019-11-24 NOTE — Plan of Care (Addendum)
PCCM Plan of Care and Family Communication Note  63 yo F admitted earlier this morning s/p cardiac arrest, found to have [place], ARDS of unknown etiology, bradycardia requiring external pacing and shock requiring pressors.   2 brothers arrived at the bedside this morning. Dr. Christella Noa, NSGY and I provided updates regarding this unfortunate case. Recommendation for DNR made at that time, without conversion in code status as family wished to discuss further with other family members.   Over the course of the morning the patient has been progressively desaturating despite FiO2 100% and PEEP 18. At 925 am the patient acutely desaturated to 82% and had copious amount of blood tinged secretions suctioned from ETT with some transient improvement in oxygenation. I called Andrea Carr (brother designated as primary contact) to discuss these developments and recommend consideration of DNR status. Andrea Carr asked that this be discussed with brother, Andrea Carr, who felt that updating code status to DNR is most appropriate at this time, with no escalation of care while family continues to discuss further [place].   Plan -DNR -Continue current interventions with no escalation of care      Time spent in discussion 40 minutes  Andrea Gum MSN, AGACNP-BC [place] [phone] If no answer, [phone] 11/07/2019, 9:36 AM  Attending Note:  63 year old year old female with HTN who presents with VT and found to have [place] with edema and severe ARDS.  Overnight, continues to be hypoxemic profoundly maxed on the vent.  On exam, saturation of 80 on 100 with high PEEP and very poor air movement.  I reviewed CXR myself, ETT is ok but sever infiltrate noted.  Discussed with PCCM-NP.  Given hemodynamics and respiratory failure as well as the severity of her SAH and edema I do not believe that further escalation of care is appropriate or beneficial.  Spoke with family, no further escalation of care and DNR  status.  Given presentation and CXR however would like to see a PCR for COVID-19 as they only sent the rapid.  Will re-order and place on isolation.  PCCM will continue to manage.  The patient is critically ill with multiple organ systems failure and requires high complexity decision making for assessment and support, frequent evaluation and titration of therapies, application of advanced monitoring technologies and extensive interpretation of multiple databases.   Critical Care Time devoted to patient care services described in this note is  45  Minutes. This time reflects time of care of this signee Dr Jennet Maduro. This critical care time does not reflect procedure time, or teaching time or supervisory time of PA/NP/Med student/Med Resident etc but could involve care discussion time.  Rush Farmer, M.D. [facility] Pulmonary/Critical Care Medicine.

## 2019-11-24 NOTE — H&P (Addendum)
NAME:  Andrea Carr, MRN:  [id number], DOB:  1956-04-13, LOS: 0 ADMISSION DATE:  12-12-2019, CONSULTATION DATE:  Dec 12, 2019 REFERRING MD:  Dr. Elesa Massed, CHIEF COMPLAINT:  Cardiac arrest  Brief History   63 y.o. F found down in her bathroom, in PEA arrest s/p CPR and ROSC.  Found to have extensive SAH and concern for multi-focal PNA/ARDS.    History of present illness   63 y.o. F with PMH of morbid obesity and HTN who was found down by a friend or family member in the bathroom with blood coming out of her nose and mouth, apparently down for about 30 minutes prior.  On EMS arrival, she was in PEA arrest and given CPR for approximately 15 minutes, Epi x3, with one episode of V-tach and defibrillation prior to achieving ROSC. Pt intubated and became bradycardic and was paced en route to the ED.   In arrival, pt was minimally responsive.  Head CT showed diffuse extensive SAH throughout the cisterns with peripheral extension and CXR with patchy multifocal airspace opacities likely due to PNA or ARDS.    Flu and Covid-19 antigen tests were negative.  Labs were significant for lactic acid of 6.8, WBC 19.9, troponin 102.      Neurosurgery was consulted and requested CTA brain.  PCCM consulted for admission.  Past Medical History  HTN, Obesity  Significant Hospital Events   12/26 Admit to PCCM  Consults:  Neurosurgery Neurology  Procedures:  ETT 12/26-  Significant Diagnostic Tests:  12/26 CXR>>Diffuse airspace opacities throughout both lungs which could be due to pulmonary edema, ARDS, and/or multifocal pneumonia. 12/26 CT Head>>Diffuse extensive subarachnoid hemorrhage throughout the cisterns with peripheral extension. Diffuse cerebral edema without downward herniation. 12/26 CT C-spine>>No fractures or malalignment 12/26 CT Maxillofacial>>no facial fracture 12/26 CTA Chest>>   Micro Data:  12/26 BC x2>> 12/26 Covid-19, Influenza>>negative 12/26 Urine Culture>>  Antimicrobials:    Vancomycin 12/26- Cefepeime 12/26-   Interim history/subjective:  Pt minimally responsive, on full vent support  Objective   Blood pressure 106/74, pulse (!) 51, temperature (!) 91.8 F (33.2 C), resp. rate 18, weight (!) 167.8 kg, SpO2 91 %.    Vent Mode: PRVC FiO2 (%):  [70 %-100 %] 70 % Set Rate:  [18 bmp] 18 bmp Vt Set:  [500 mL] 500 mL PEEP:  [5 cmH20-12 cmH20] 12 cmH20 Plateau Pressure:  [35 cmH20] 35 cmH20   Intake/Output Summary (Last 24 hours) at 12/12/2019 0326 Last data filed at 12/12/2019 8309 Gross per 24 hour  Intake 3100 ml  Output --  Net 3100 ml   Filed Weights   12/12/2019 0106  Weight: (!) 167.8 kg    General:  Morbidly obese female, intubated and minimally responsive HEENT: large frontal forehead hematoma, dried blood around the lips and nose, no lacerations Neuro: unresponsive without sedation, no corneal reflex, pupils 35mm minimally responsive  CV: s1s2, bradycardic, regular, no m/r/g PULM:  Diffuse rhonchi bilaterally throughout, ETT in place GI: soft, bsx4 active, foley catheter in place Extremities: warm/dry, 2+ edema  Skin: no rashes or lesions   Resolved Hospital Problem list     Assessment & Plan:   Subarachnoid Hemorrhage with fall and Cardiac Arrest -s/p 15 minutes CPR, V-tach and defibrillation x1 before achieving ROSC -Neurosurgery consulted, CTA pending P: -Admit to intensive Care, CTA pending, appreciate Neurosurgery recommendations -Goal SBP <160 for now, on Levophed at the time of admission -Monitor for hyponatremia and hypovolemia, closely monitor I&O -Consider TCD's, Nimodipine if BP allows  -  check DIC panel -Also consider EEG and seizure prophylaxis, very poor neuro exam on presentation -Very poor prognosis, will attempt to reach family tonight    Acute Respiratory failure - likely secondary to both neurologic deterioration and bilateral patchy infiltrates with sepsis physiology -Initial Flu and Covid  negative -intubated in the ED P: -Maintain full vent support with SAT/SBT as tolerated -titrate Vent setting to maintain SpO2 greater than or equal to 90%. -HOB elevated 30 degrees. -Plateau pressures less than 30 cm H20.  -Follow chest x-ray, ABG prn.   -Bronchial hygiene and RT/bronchodilator protocol. -Received 1L NS in the ED, follow lactic acid and blood/sputum culture results, urine strep and legionella -Continue empiric coverage with Vanc/Cefepime -BNP mildly elevated, bedside echo with no large effusion or obvious failure, obtain formal echo -CTA chest pending    Elevated LFT's -possibly secondary to shock liver, bilirubin an UDS normal P: -follow metabolic panel   AGMA -likely secondary to lactic acidosis P: -check ABG and follow repeat CMP       Best practice:  Diet: NPO Pain/Anxiety/Delirium protocol (if indicated): prn Fentanyl VAP protocol (if indicated): yes DVT prophylaxis: SCD GI prophylaxis: protonix Glucose control: SSI Mobility: bed rest Code Status: Full Family Communication: Sister in law has called, will attempt to reach pending CTA and neurosurgery recommendations Disposition: ICU  Labs   CBC: Recent Labs  Lab 07/25/2019 0100 07/25/2019 0111  WBC 19.9*  19.9*  --   NEUTROABS 10.6*  --   HGB 15.9*  16.0* 18.0*  HCT 51.0*  52.5* 53.0*  MCV 104.1*  104.4*  --   PLT 177  181  --     Basic Metabolic Panel: Recent Labs  Lab 07/25/2019 0100 07/25/2019 0111 07/25/2019 0113  NA 139 140  --   K 4.7 4.3  --   CL 105 105  --   CO2 17*  --   --   GLUCOSE 222* 214*  --   BUN 16 19  --   CREATININE 1.16* 0.80  --   CALCIUM 8.5*  --   --   MG  --   --  2.2   GFR: CrCl cannot be calculated (Unknown ideal weight.). Recent Labs  Lab 07/25/2019 0100 07/25/2019 0101  WBC 19.9*  19.9*  --   LATICACIDVEN  --  6.8*    Liver Function Tests: Recent Labs  Lab 07/25/2019 0100  AST 216*  ALT 115*  ALKPHOS 165*  BILITOT 0.8  PROT 6.6  ALBUMIN  3.4*   No results for input(s): LIPASE, AMYLASE in the last 168 hours. No results for input(s): AMMONIA in the last 168 hours.  ABG    Component Value Date/Time   TCO2 25 08/26/202020 0111     Coagulation Profile: Recent Labs  Lab 07/25/2019 0100  INR 1.2    Cardiac Enzymes: No results for input(s): CKTOTAL, CKMB, CKMBINDEX, TROPONINI in the last 168 hours.  HbA1C: No results found for: HGBA1C  CBG: Recent Labs  Lab 07/25/2019 0117  GLUCAP 180*    Review of Systems:   Unable to obtain secondary to mental status  Past Medical History  She,  has no past medical history on file.   Surgical History    Past Surgical History:  Procedure Laterality Date  . ABDOMINAL HYSTERECTOMY    . TONSILLECTOMY       Social History   reports that she has been smoking. She has never used smokeless tobacco. She reports that she does not drink alcohol  or use drugs.   Family History   Her family history is not on file.   Allergies Allergies  Allergen Reactions  . Lisinopril Swelling  . Penicillins     Unknown Has patient had a PCN reaction causing immediate rash, facial/tongue/throat swelling, SOB or lightheadedness with hypotension:YES Has patient had a PCN reaction causing severe rash involving mucus membranes or skin necrosis:NO Has patient had a PCN reaction that required hospitalization NO Has patient had a PCN reaction occurring within the last 10 years: NO If all of the above answers are "NO", then may proceed with Cephalosporin use.     Home Medications  Prior to Admission medications   Medication Sig Start Date End Date Taking? Authorizing Provider  acetaminophen (TYLENOL) 500 MG tablet Take 500 mg by mouth every 6 (six) hours as needed for mild pain.    [provider]  cephALEXin (KEFLEX) 500 MG capsule Take 1 capsule (500 mg total) by mouth 4 (four) times daily. 05/05/16   Ward, Delice Bison, DO  hydrocortisone cream 1 % Apply to affected area 2 times daily  11/03/17   Hedges, Dellis Filbert, PA-C  loratadine (CLARITIN) 10 MG tablet Take 1 tablet (10 mg total) by mouth daily. 11/03/17   Hedges, Dellis Filbert, PA-C  ranitidine (ZANTAC) 150 MG capsule Take 150 mg by mouth daily as needed for heartburn.    [provider]     Critical care time: 65 minutes      CRITICAL CARE Performed by: Otilio Carpen Kyeisha Janowicz   Total critical care time: 65 minutes  Critical care time was exclusive of separately billable procedures and treating other patients.  Critical care was necessary to treat or prevent imminent or life-threatening deterioration.  Critical care was time spent personally by me on the following activities: development of treatment plan with patient and/or surrogate as well as nursing, discussions with consultants, evaluation of patient's response to treatment, examination of patient, obtaining history from patient or surrogate, ordering and performing treatments and interventions, ordering and review of laboratory studies, ordering and review of radiographic studies, pulse oximetry and re-evaluation of patient's condition.   Otilio Carpen Marita Burnsed, PA-C [facility]  Pager# [phone], if no answer [phone]

## 2019-11-24 NOTE — Progress Notes (Signed)
Family Contacts:  Leighton Ruff (Brother, Visitor, Primary Contact): [phone]  Particia Lather Moore, Jamie's wife): [phone]  Gara Kroner (Brother): [phone]

## 2019-11-24 NOTE — Progress Notes (Addendum)
[facility] Progress Note Patient Name: Andrea Carr DOB: 09/22/1956 MRN: [id number]   Date of Service  2019-12-07  HPI/Events of Note  Patient admitted to [facility] 4N ICU from the ED following out of hospital cardiac arrest, Pt found to have a large sub-arachnoid hemorrhage with coma. Her oxygenation has been marginal, and she has been bradycardic, presumably from her intracranial process.  eICU Interventions  ABG now, will have external pacing pads placed on patient, New Patient Evaluation completed. I spoke with patient's brother and sister-in-law and fully updated them on her status, and answered all questions.        [name] 12/07/2019, 4:49 AM

## 2019-11-24 NOTE — ED Notes (Signed)
CCM at bedside 

## 2019-11-25 ENCOUNTER — Other Ambulatory Visit (HOSPITAL_COMMUNITY): Payer: Medicare Other

## 2019-11-25 DIAGNOSIS — R579 Shock, unspecified: Secondary | ICD-10-CM

## 2019-11-25 DIAGNOSIS — Z515 Encounter for palliative care: Secondary | ICD-10-CM

## 2019-11-25 DIAGNOSIS — I609 Nontraumatic subarachnoid hemorrhage, unspecified: Secondary | ICD-10-CM

## 2019-11-25 LAB — URINE CULTURE: Culture: NO GROWTH

## 2019-11-25 MED ORDER — GLYCOPYRROLATE 1 MG PO TABS: 1.0000 mg | ORAL_TABLET | ORAL | Status: DC | PRN

## 2019-11-25 MED ORDER — DEXTROSE 5 % IV SOLN: INTRAVENOUS | Status: DC

## 2019-11-25 MED ORDER — ACETAMINOPHEN 650 MG RE SUPP: 650.0000 mg | Freq: Four times a day (QID) | RECTAL | Status: DC | PRN

## 2019-11-25 MED ORDER — ONDANSETRON HCL 4 MG/2ML IJ SOLN: 4.0000 mg | Freq: Four times a day (QID) | INTRAMUSCULAR | Status: DC | PRN

## 2019-11-25 MED ORDER — ONDANSETRON 4 MG PO TBDP: 4.0000 mg | ORAL_TABLET | Freq: Four times a day (QID) | ORAL | Status: DC | PRN

## 2019-11-25 MED ORDER — GLYCOPYRROLATE 0.2 MG/ML IJ SOLN
0.2000 mg | INTRAMUSCULAR | Status: DC | PRN
  Administered 2019-11-25: 0.2 mg via INTRAVENOUS
  Filled 2019-11-25: qty 1

## 2019-11-25 MED ORDER — ACETAMINOPHEN 325 MG PO TABS: 650.0000 mg | ORAL_TABLET | Freq: Four times a day (QID) | ORAL | Status: DC | PRN

## 2019-11-25 MED ORDER — LORAZEPAM 2 MG/ML IJ SOLN: 2.0000 mg | INTRAMUSCULAR | Status: DC | PRN

## 2019-11-25 MED ORDER — GLYCOPYRROLATE 0.2 MG/ML IJ SOLN: 0.2000 mg | INTRAMUSCULAR | Status: DC | PRN

## 2019-11-25 MED ORDER — POLYVINYL ALCOHOL 1.4 % OP SOLN
1.0000 [drp] | Freq: Four times a day (QID) | OPHTHALMIC | Status: DC | PRN
  Filled 2019-11-25: qty 15

## 2019-11-25 MED ORDER — DIPHENHYDRAMINE HCL 50 MG/ML IJ SOLN: 25.0000 mg | INTRAMUSCULAR | Status: DC | PRN

## 2019-11-26 LAB — LEGIONELLA PNEUMOPHILA SEROGP 1 UR AG: L. pneumophila Serogp 1 Ur Ag: NEGATIVE

## 2019-11-29 LAB — CULTURE, BLOOD (ROUTINE X 2)
Culture: NO GROWTH
Culture: NO GROWTH
Special Requests: ADEQUATE

## 2019-11-30 NOTE — Plan of Care (Signed)
Patient has poor prognosis and family has chosen to provide comfort care measures, today.

## 2019-11-30 NOTE — Progress Notes (Addendum)
NAME:  Andrea Carr, MRN:  [id number], DOB:  12/04/1955, LOS: 1 ADMISSION DATE:  03-10-2019, CONSULTATION DATE:  11/16/2019 REFERRING MD:  Dr. Elesa MassedWard, CHIEF COMPLAINT:  Cardiac arrest  Brief History   64 y.o. F found down in her bathroom, in PEA arrest s/p CPR and ROSC.  Found to have extensive SAH and concern for multi-focal PNA/ARDS.    History of present illness   64 y.o. F with PMH of morbid obesity and HTN who was found down by a friend or family member in the bathroom with blood coming out of her nose and mouth, apparently down for about 30 minutes prior.  On EMS arrival, she was in PEA arrest and given CPR for approximately 15 minutes, Epi x3, with one episode of V-tach and defibrillation prior to achieving ROSC. Pt intubated and became bradycardic and was paced en route to the ED.   In arrival, pt was minimally responsive.  Head CT showed diffuse extensive SAH throughout the cisterns with peripheral extension and CXR with patchy multifocal airspace opacities likely due to PNA or ARDS.    Flu and Covid-19 antigen tests were negative.  Labs were significant for lactic acid of 6.8, WBC 19.9, troponin 102.      Neurosurgery was consulted and requested CTA brain.  PCCM consulted for admission.  Past Medical History  HTN, Obesity  Significant Hospital Events   12/26 Admit to PCCM  Consults:  Neurosurgery Neurology  Procedures:  ETT 12/26-  Significant Diagnostic Tests:  12/26 CXR>>Diffuse airspace opacities throughout both lungs which could be due to pulmonary edema, ARDS, and/or multifocal pneumonia. 12/26 CT Head>>Diffuse extensive subarachnoid hemorrhage throughout the cisterns with peripheral extension. Diffuse cerebral edema without downward herniation. 12/26 CT C-spine>>No fractures or malalignment 12/26 CT Maxillofacial>>no facial fracture 12/26 CTA Chest>>   Micro Data:  12/26 BC x2>> 12/26 Covid-19, Influenza>>negative 12/26 Urine Culture>>  Antimicrobials:    Vancomycin 12/26- Cefepeime 12/26-   Interim history/subjective:  Witnessed sustained tonic clonic seizure > 5 minutes this morning. Propfol increased and BZD administered with resolution of tonic clonic activity   Objective   Blood pressure 90/60, pulse 96, temperature 99.7 F (37.6 C), resp. rate (!) 32, height 5\' 6"  (1.676 m), weight (!) 167 kg, SpO2 100 %.    Vent Mode: PRVC FiO2 (%):  [100 %] 100 % Set Rate:  [24 bmp] 24 bmp Vt Set:  [360 mL] 360 mL PEEP:  [18 cmH20] 18 cmH20 Plateau Pressure:  [12 cmH20] 12 cmH20   Intake/Output Summary (Last 24 hours) at 11/27/2019 0741 Last data filed at 11/19/2019 0725 Gross per 24 hour  Intake 1230.3 ml  Output 1050 ml  Net 180.3 ml   Filed Weights   2019-02-16 0106 11/12/2019 0438  Weight: (!) 167.8 kg (!) 167 kg    General:  Morbidly obese adult F, supine in bed intubated and sedated  HEENT: Forehead hematoma. ETT secure. Injected sclera. Dried blood on lips.  Neuro: When propofol is paused patient exhibits myoclonus. 1mm pupils. Unresponsive to painful stimuli  CV: bradycardic rate. s1s2 no rgm.  PULM:  Diffuse rhonchi and course crackles. Bright red bloody secretions  GI: obese, soft, ndnt hypoactive x4  Extremities: warm, dry, no obvious deformity.  Skin: without rash or anterior lesion    Resolved Hospital Problem list     Assessment & Plan:   Subarachnoid Hemorrhage with fall and Cardiac Arrest, likely anoxic injury, myoclonus and now concerning for SE  -s/p 15 minutes CPR, V-tach and defibrillation  x1 before achieving ROSC -Neurosurgery consulted, CTA pending P: -NSGY has consulted and notified family that there are no surgical interventions  -Very grim prognosis has been thoroughly communicated with family, patient is DNR and no escalation of care. Family plan was for comfort care 12/26 after arrival of out of town family, however this did not occur overnight with unplanned family arriving in lieu of planned  family -This case is very unfortunate and continuing care while neurologic function continues to decline (with myoclonus beginning 12/26, apparent SE on 12/27 despite continuous propofol) I feel that we have likely surpassed a point of medical futility (especially in light of what patient has previously articulated as acceptable quality of life) and recommend consideration of compassionate extubation and comfort care  -We will continue propofol and PRN BZDs for seizures with development of SE from myoclonus. I do not feel that an EEG will be of benefit in this case unless family changes intentions for care and wishes to prolong, in which case an EEG may be useful for illustration of neurologic decline   Acute Respiratory failure Pulmonary hemorrhage ARDS P: -Unfortunately we are presently on FiO2 100% with PEEP 18, with PaO2 44. Aggressive measure for ARDS management are unlikely to yield benefit in setting of devastating neurologic injury and for these reasons proning, paralytics are not offered -We will maintain current vent settings and continue aggressive pulm hygiene especially in setting of pulmonary hemorrhage -With no escalation of care, we will not trend CXR, we will not obtain ABGs   GIB -BRBPR collecting 12/27 via BMS  -this is unfortunately a likely manifestation of likely ischemic injury, MODS  P: -with no escalation of care we will not transfuse nor trend H/H   Best practice:  Diet: NPO Pain/Anxiety/Delirium protocol (if indicated): Propofol, fentanyl ativan  VAP protocol (if indicated): yes DVT prophylaxis: na  GI prophylaxis: na Glucose control: na Mobility: bed rest Code Status: DNR  Family Communication: Prolonged conversations with family 12/26 with progressively poor prognosis. Family planned for withdrawal 12/26 evening but has changed mind. Updated again 12/27 re-iterating poor prognosis and interval development of sustained seizure > 5 minutes with concern for  medical futility. Family is coning to bedside today  Disposition: ICU  Labs   CBC: Recent Labs  Lab 11/01/2019 0100 11/03/2019 0111 11/15/2019 0145 11/22/2019 0516 11/02/2019 1524  WBC 19.9*  19.9*  --   --   --  10.8*  NEUTROABS 10.6*  --   --   --   --   HGB 15.9*  16.0* 18.0* 17.0* 17.0* 17.1*  HCT 51.0*  52.5* 53.0* 50.0* 50.0* 53.1*  MCV 104.1*  104.4*  --   --   --  98.2  PLT 177  181  --   --   --  75*    Basic Metabolic Panel: Recent Labs  Lab 11/23/2019 0100 11/01/2019 0111 11/05/2019 0113 11/05/2019 0145 11/23/2019 0516 11/21/2019 1524  NA 139 140  --  141 139 141  K 4.7 4.3  --  4.1 4.0 5.2*  CL 105 105  --   --   --  111  CO2 17*  --   --   --   --  18*  GLUCOSE 222* 214*  --   --   --  98  BUN 16 19  --   --   --  27*  CREATININE 1.16* 0.80  --   --   --  1.25*  CALCIUM 8.5*  --   --   --   --  7.7*  MG  --   --  2.2  --   --  1.8  PHOS  --   --   --   --   --  4.0   GFR: Estimated Creatinine Clearance: 75.4 mL/min (A) (by C-G formula based on SCr of 1.25 mg/dL (H)). Recent Labs  Lab 11/26/2019 0100 10/31/2019 0101 10/30/2019 1524  WBC 19.9*  19.9*  --  10.8*  LATICACIDVEN  --  6.8* 3.1*    Liver Function Tests: Recent Labs  Lab 11/12/2019 0100 11/08/2019 1524  AST 216* 166*  ALT 115* 88*  ALKPHOS 165* 106  BILITOT 0.8 1.4*  PROT 6.6 5.3*  ALBUMIN 3.4* 2.8*   No results for input(s): LIPASE, AMYLASE in the last 168 hours. No results for input(s): AMMONIA in the last 168 hours.  ABG    Component Value Date/Time   PHART 7.286 (L) 11/23/2019 0516   PCO2ART 47.6 11/01/2019 0516   PO2ART 44.0 (L) 11/02/2019 0516   HCO3 24.0 11/26/2019 0516   TCO2 26 11/13/2019 0516   ACIDBASEDEF 5.0 (H) 11/09/2019 0516   O2SAT 84.0 11/27/2019 0516     Coagulation Profile: Recent Labs  Lab 11/23/2019 0100  INR 1.2    Cardiac Enzymes: No results for input(s): CKTOTAL, CKMB, CKMBINDEX, TROPONINI in the last 168 hours.  HbA1C: No results found for:  HGBA1C  CBG: Recent Labs  Lab 11/27/2019 0117  GLUCAP 180*    CRITICAL CARE Performed by: Lanier Clam   Total critical care time: 30 minutes  Critical care time was exclusive of separately billable procedures and treating other patients. Critical care was necessary to treat or prevent imminent or life-threatening deterioration.  Critical care was time spent personally by me on the following activities: development of treatment plan with patient and/or surrogate as well as nursing, discussions with consultants, evaluation of patient's response to treatment, examination of patient, obtaining history from patient or surrogate, ordering and performing treatments and interventions, ordering and review of laboratory studies, ordering and review of radiographic studies, pulse oximetry and re-evaluation of patient's condition.  Attending Note:  64 year old female s/p PEA arrest that was found to have a large SAH with MODS.  Overnight, increase in O2 and PEEP demand as well as very labile blood pressure.  On exam, completely unresponsive but has a respiratory drive with diminished breath sounds bilaterally and blood from the ETT, OGT and rectal tube.  I reviewed CXR myself, ETT is in a good position with severe infiltrate in both lungs.  Discussed with PCCM-NP.  Family has arrived.  They are ready for withdrawal.  Confirmed code status and placed withdrawal orderset on propofol and morphine given her severe myoclonus off propofol for comfort purposes.  The patient is critically ill with multiple organ systems failure and requires high complexity decision making for assessment and support, frequent evaluation and titration of therapies, application of advanced monitoring technologies and extensive interpretation of multiple databases.   Critical Care Time devoted to patient care services described in this note is  31  Minutes. This time reflects time of care of this signee Dr Koren Bound. This  critical care time does not reflect procedure time, or teaching time or supervisory time of PA/NP/Med student/Med Resident etc but could involve care discussion time.  Alyson Reedy, M.D. [facility] Pulmonary/Critical Care Medicine.

## 2019-11-30 NOTE — Progress Notes (Signed)
Patient found seizing this morning, having generalized twitching of both upper extremities, chest and head. Increased propofol gtt and prn ativan given. Red Christians, NP notified.

## 2019-11-30 NOTE — Progress Notes (Signed)
Family decided to move forward with comfort care & compassionate extubation. Patient extubated at 1150 per MD order.  Ashley Mariner RRT

## 2019-11-30 NOTE — Progress Notes (Signed)
  NOK:  SonRoselyn Reef [employer] [place]. Gbo [phone]  Cell phone on record in [place].  Chaplain offered prayer and grief support. Provided NOK with patient placement card.  They need to consult with family to confirm funeral home.  Roselyn Reef will contact [facility] by tomorrow morning.    Please call if f/u services are needed.   Luana Shu [phone]    11/03/2019 1200  Clinical Encounter Type  Visited With Patient and family together  Visit Type Initial;Patient actively dying  Referral From Nurse  Consult/Referral To Chaplain  Spiritual Encounters  Spiritual Needs Prayer  Stress Factors  Patient Stress Factors Major life changes  Family Stress Factors Major life changes

## 2019-11-30 NOTE — Progress Notes (Signed)
Time of Death confirmed at 1228 with Justin Mend, RN and myself. No heart and lung sounds auscultated. Brothers were present at bedside.

## 2019-11-30 NOTE — Plan of Care (Signed)
PCCM Plan of Care/ Family communication  Brothers Legrand [place] and Roselyn Reef arrived at the bedside for Andrea Carr discussion and daily updates. After reviewing the patient's clinical course, a decision was reached to pursue comfort care with compassionate extubation.   In setting of SE this morning, we will continue the patient on propofol at time of extubation.  P -DNR -comfort care, with propofol, analgesia, secretion management -chaplain    Eliseo Gum MSN, AGACNP-BC [place] [phone] If no answer, [phone] 11/12/2019, 9:51 AM

## 2019-11-30 NOTE — Progress Notes (Signed)
Patient belongings given to Roselyn Reef (Brother), multiple yellow metal jewelry in container.

## 2019-11-30 DEATH — deceased

## 2019-12-31 NOTE — Death Summary Note (Signed)
DEATH SUMMARY   Patient Details  Name: Andrea Carr MRN: [id number] DOB: [birth year]  Admission/Discharge Information   Admit Date:  2019-12-07  Date of Death: Date of Death: Dec 08, 2019  Time of Death: Time of Death: Mar 26, 1227  Length of Stay: 1  Referring Physician: Patient, No Pcp Per   Reason(s) for Hospitalization  PEA Cardiac Arrest  Diagnoses  Preliminary cause of death:   PEA Cardiac Arrest Secondary Diagnoses (including complications and co-morbidities):  Active Problems:   Subarachnoid hemorrhage (HCC) Acute hypoxemic respiratory failure Anoxic encephalopathy Circulatory shock Palliative care encounter  Brief Hospital Course (including significant findings, care, treatment, and services provided and events leading to death)  64 y.o. F with PMH of morbid obesity and HTN who was found down by a friend or family member in the bathroom with blood coming out of her nose and mouth, apparently down for about 30 minutes prior.  On EMS arrival, she was in PEA arrest and given CPR for approximately 15 minutes, Epi x3, with one episode of V-tach and defibrillation prior to achieving ROSC. Pt intubated and became bradycardic and was paced en route to the ED.   In arrival, pt was minimally responsive.  Head CT showed diffuse extensive SAH throughout the cisterns with peripheral extension and CXR with patchy multifocal airspace opacities likely due to PNA or ARDS.    Flu and Covid-19 antigen tests were negative.  Labs were significant for lactic acid of 6.8, WBC 19.9, troponin 102.      Neurosurgery was consulted and requested CTA brain.  PCCM consulted for admission.  Brothers Legrand Como and Roselyn Reef arrived at the bedside for [place] discussion and daily updates. After reviewing the patient's clinical course, a decision was reached to pursue comfort care with compassionate extubation.   In setting of SE this morning, we will continue the patient on propofol at time of extubation.  Patient was  extubated and expired comfortable shortly thereafter with the family bedside.    Pertinent Labs and Studies  Significant Diagnostic Studies CT Angio Head W or Wo Contrast  Addendum Date: 12-07-19   ADDENDUM REPORT: 12/07/19 05:52 ADDENDUM: Critical Value/emergent results were called by telephone at the time of interpretation on Dec 07, 2019 at 5:51 am to Dr. Christella Noa, who verbally acknowledged these results. Electronically Signed   By: Ulyses Jarred M.D.   On: 12-07-2019 05:52   Result Date: 12/07/2019 CLINICAL DATA:  Subarachnoid hemorrhage follow up EXAM: CT ANGIOGRAPHY HEAD TECHNIQUE: Multidetector CT imaging of the head was performed using the standard protocol during bolus administration of intravenous contrast. Multiplanar CT image reconstructions and MIPs were obtained to evaluate the vascular anatomy. CONTRAST:  120mL OMNIPAQUE IOHEXOL 350 MG/ML SOLN COMPARISON:  None. FINDINGS: POSTERIOR CIRCULATION: --Vertebral arteries: Normal V4 segments. --Posterior inferior cerebellar arteries (PICA): Patent origins from the vertebral arteries. --Anterior inferior cerebellar arteries (AICA): Patent origins from the basilar artery. --Basilar artery: Distal fenestration. --Superior cerebellar arteries: Normal. --Posterior cerebral arteries: Normal. There are bilateral posterior communicating arteries (p-comm) that partially supply the PCAs. 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 There is a triangular outpouching projecting superiorly. --Anterior cerebral arteries (ACA): Normal. Both A1 segments are present. Patent anterior communicating artery (a-comm). --Middle cerebral arteries (MCA): Normal. Venous sinuses: As permitted by contrast timing, patent. Anatomic variants: None There is redemonstration of diffuse subarachnoid hemorrhage, less clearly characterized  in the presence of intravenous contrast material.  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 Electronically Signed: By: Deatra RobinsonKevin  Herman M.D. On: 11/02/2019 05:38   DG Abd 1 View  Result Date: 11/17/2019 CLINICAL DATA:  OG placement, post CPR EXAM: ABDOMEN - 1 VIEW COMPARISON:  None. FINDINGS: Transesophageal tube tip is positioned at the level of the gastric fundus. The side port is positioned at the GE junction. Gaseous distention of transverse colon is noted. Few air-filled but nondistended loops of small bowel are noted in the mid abdomen. Pacer pads overlie the chest. Soft tissue mineralization projects over the left abdominal wall. IMPRESSION: 1. Transesophageal tube tip is positioned at the level of the gastric fundus. The side port is at the GE junction. Could consider advancing 3-5 cm for optimal function. Electronically Signed   By: Kreg ShropshirePrice  DeHay M.D.   On: 10/30/2019 01:32   CT Head Wo Contrast  Result Date: 10/30/2019 CLINICAL DATA:  Facial trauma found down EXAM: CT HEAD WITHOUT CONTRAST TECHNIQUE: Contiguous axial images were obtained from the base of the skull through the vertex without intravenous contrast. COMPARISON:  None. FINDINGS: Brain: There is diffuse subarachnoid hemorrhage seen throughout the suprasellar cistern, interhemispheric cistern, sylvian cistern, and basilar cisterns with peripheral extension. No intraventricular or extra-axial collections are seen. There is diffuse edema seen throughout the cerebral hemispheres. However no downward herniation is noted. Vascular: No unexpected calcifications seen. Skull: The skull is intact. No fracture or focal lesion identified. Sinuses/Orbits: The visualized paranasal sinuses and mastoid air cells are clear. The orbits and globes intact. Other: Soft tissue hematoma seen overlying the right frontal skull. Endotracheal tube is seen. Face:  Osseous: No acute fracture or other significant osseous abnormality.The nasal bone, mandibles, zygomatic arches and pterygoid plates are intact. Orbits: No fracture identified. Unremarkable appearance of globes and orbits. Sinuses: The visualized paranasal sinuses and mastoid air cells are unremarkable. Soft tissues: Soft tissue hematoma seen overlying the right frontal skull. Cervical spine: Alignment: Straightening of the normal cervical lordosis. Skull base and vertebrae: Visualized skull base is intact. No atlanto-occipital dissociation. The vertebral body heights are well maintained. No fracture or pathologic osseous lesion seen. Soft tissues and spinal canal: The visualized paraspinal soft tissues are unremarkable. No prevertebral soft tissue swelling is seen. The spinal canal is grossly unremarkable, no large epidural collection or significant canal narrowing. Disc levels:  No significant canal or neural foraminal narrowing. Upper chest: Multifocal patchy dense areas of consolidation seen throughout both lungs, with dense areas of consolidation in the posterior right upper lung. Other: None IMPRESSION: 1. Diffuse extensive subarachnoid hemorrhage throughout the cisterns with peripheral extension. Diffuse cerebral edema without downward herniation. 2. Soft tissue hematoma overlying the right frontal skull. 3. No facial fracture. 4.  No acute fracture or malalignment of the spine. 5. Extensive partially visualized multifocal patchy airspace opacities likely due to multifocal pneumonia and/or ARDS. 6. These critical results were called by telephone at the time of interpretation on 11/12/2019 at 2:47 am to provider [facility] , who verbally acknowledged these results. Electronically Signed   By: Jonna ClarkBindu  Avutu M.D.   On: 11/13/2019 02:50   CT Angio Chest PE W and/or Wo Contrast  Result Date: 11/11/2019 CLINICAL DATA:  Shortness of breath. Cardiac arrest post CPR. Evaluate for pulmonary embolism. EXAM: CT  ANGIOGRAPHY CHEST WITH CONTRAST TECHNIQUE: Multidetector CT imaging of the chest was performed using the standard protocol during bolus administration of intravenous contrast. Multiplanar CT image  reconstructions and MIPs were obtained to evaluate the vascular anatomy. CONTRAST:  OMNIPAQUE IOHEXOL 350 MG/ML SOLN COMPARISON:  None. FINDINGS: Cardiovascular: Mild cardiomegaly. Thoracic aorta is normal in caliber and otherwise unremarkable. Pulmonary arterial system is well opacified without evidence of emboli. Remaining vascular structures are unremarkable. Mediastinum/Nodes: No mediastinal or hilar adenopathy. Remaining mediastinal structures are unremarkable. Lungs/Pleura: Endotracheal tube has tip 3.4 cm above the carina. There is significant consolidation throughout both lungs right worse than left. This consolidation is fairly dense over the posterior lower lobes and right upper lobe. No evidence of effusion. Airways otherwise unremarkable. Upper Abdomen: Nasogastric tube has tip over the stomach. Possible tiny nonobstructing right renal stones. Musculoskeletal: Mild degenerative change of the spine. Several bilateral anterior acute rib fractures left worse than right involving left ribs 2 through 8 and right ribs 4 through 6. Review of the MIP images confirms the above findings. IMPRESSION: 1.  No evidence of pulmonary embolism. 2. Significant bilateral airspace consolidation right worse than left which may be due to lobar collapse/atelectasis versus multifocal infection. No effusion. 3. Multiple bilateral acute anterior rib fractures left worse than right as described. 4.  Tubes and lines as described. 5.  Possible tiny nonobstructing right renal stones. Electronically Signed   By: Elberta Fortis M.D.   On: 12-09-2019 05:39   CT Cervical Spine Wo Contrast  Result Date: December 09, 2019 CLINICAL DATA:  Facial trauma found down EXAM: CT HEAD WITHOUT CONTRAST TECHNIQUE: Contiguous axial images were obtained  from the base of the skull through the vertex without intravenous contrast. COMPARISON:  None. FINDINGS: Brain: There is diffuse subarachnoid hemorrhage seen throughout the suprasellar cistern, interhemispheric cistern, sylvian cistern, and basilar cisterns with peripheral extension. No intraventricular or extra-axial collections are seen. There is diffuse edema seen throughout the cerebral hemispheres. However no downward herniation is noted. Vascular: No unexpected calcifications seen. Skull: The skull is intact. No fracture or focal lesion identified. Sinuses/Orbits: The visualized paranasal sinuses and mastoid air cells are clear. The orbits and globes intact. Other: Soft tissue hematoma seen overlying the right frontal skull. Endotracheal tube is seen. Face: Osseous: No acute fracture or other significant osseous abnormality.The nasal bone, mandibles, zygomatic arches and pterygoid plates are intact. Orbits: No fracture identified. Unremarkable appearance of globes and orbits. Sinuses: The visualized paranasal sinuses and mastoid air cells are unremarkable. Soft tissues: Soft tissue hematoma seen overlying the right frontal skull. Cervical spine: Alignment: Straightening of the normal cervical lordosis. Skull base and vertebrae: Visualized skull base is intact. No atlanto-occipital dissociation. The vertebral body heights are well maintained. No fracture or pathologic osseous lesion seen. Soft tissues and spinal canal: The visualized paraspinal soft tissues are unremarkable. No prevertebral soft tissue swelling is seen. The spinal canal is grossly unremarkable, no large epidural collection or significant canal narrowing. Disc levels:  No significant canal or neural foraminal narrowing. Upper chest: Multifocal patchy dense areas of consolidation seen throughout both lungs, with dense areas of consolidation in the posterior right upper lung. Other: None IMPRESSION: 1. Diffuse extensive subarachnoid hemorrhage  throughout the cisterns with peripheral extension. Diffuse cerebral edema without downward herniation. 2. Soft tissue hematoma overlying the right frontal skull. 3. No facial fracture. 4.  No acute fracture or malalignment of the spine. 5. Extensive partially visualized multifocal patchy airspace opacities likely due to multifocal pneumonia and/or ARDS. 6. These critical results were called by telephone at the time of interpretation on December 09, 2019 at 2:47 am to provider [facility] , who verbally acknowledged these  results. Electronically Signed   By: Jonna Clark M.D.   On: December 23, 2019 02:50   DG Chest Port 1 View  Result Date: December 23, 2019 CLINICAL DATA:  Pneumonia versus ARDS. EXAM: PORTABLE CHEST 1 VIEW COMPARISON:  12/23/19 FINDINGS: Enteric tube courses into the region of the gastroesophageal junction off the film as tip is not visualized. Endotracheal tube has tip 4.8 cm above the carina. Lungs are adequately inflated demonstrate diffuse bilateral airspace consolidation with possible slight worsening over the right base. Findings are likely due to ARDS versus multifocal pneumonia. No definite effusion. Cardiomediastinal silhouette and remainder of the exam is unchanged. IMPRESSION: Moderate bilateral airspace consolidation with possible interval worsening in the right base. Findings may be due to ARDS versus multifocal infection. Tubes and lines as described. Electronically Signed   By: Elberta Fortis M.D.   On: Dec 23, 2019 09:19   DG Chest Port 1 View  Result Date: 23-Dec-2019 CLINICAL DATA:  Post CPR EXAM: PORTABLE CHEST 1 VIEW COMPARISON:  November 17, 2008 FINDINGS: There is marked cardiomegaly. Fluffy/patchy airspace opacities are seen throughout both lungs. ETT is 2.4 cm above the carina. NG tube is seen coursing below the diaphragm. No acute osseous abnormality. IMPRESSION: Diffuse airspace opacities throughout both lungs which could be due to pulmonary edema, ARDS, and/or multifocal pneumonia.  ET tube and NG tube in satisfactory position Electronically Signed   By: Jonna Clark M.D.   On: 12/23/2019 01:33   CT Maxillofacial Wo Contrast  Result Date: December 23, 2019 CLINICAL DATA:  Facial trauma found down EXAM: CT HEAD WITHOUT CONTRAST TECHNIQUE: Contiguous axial images were obtained from the base of the skull through the vertex without intravenous contrast. COMPARISON:  None. FINDINGS: Brain: There is diffuse subarachnoid hemorrhage seen throughout the suprasellar cistern, interhemispheric cistern, sylvian cistern, and basilar cisterns with peripheral extension. No intraventricular or extra-axial collections are seen. There is diffuse edema seen throughout the cerebral hemispheres. However no downward herniation is noted. Vascular: No unexpected calcifications seen. Skull: The skull is intact. No fracture or focal lesion identified. Sinuses/Orbits: The visualized paranasal sinuses and mastoid air cells are clear. The orbits and globes intact. Other: Soft tissue hematoma seen overlying the right frontal skull. Endotracheal tube is seen. Face: Osseous: No acute fracture or other significant osseous abnormality.The nasal bone, mandibles, zygomatic arches and pterygoid plates are intact. Orbits: No fracture identified. Unremarkable appearance of globes and orbits. Sinuses: The visualized paranasal sinuses and mastoid air cells are unremarkable. Soft tissues: Soft tissue hematoma seen overlying the right frontal skull. Cervical spine: Alignment: Straightening of the normal cervical lordosis. Skull base and vertebrae: Visualized skull base is intact. No atlanto-occipital dissociation. The vertebral body heights are well maintained. No fracture or pathologic osseous lesion seen. Soft tissues and spinal canal: The visualized paraspinal soft tissues are unremarkable. No prevertebral soft tissue swelling is seen. The spinal canal is grossly unremarkable, no large epidural collection or significant canal narrowing.  Disc levels:  No significant canal or neural foraminal narrowing. Upper chest: Multifocal patchy dense areas of consolidation seen throughout both lungs, with dense areas of consolidation in the posterior right upper lung. Other: None IMPRESSION: 1. Diffuse extensive subarachnoid hemorrhage throughout the cisterns with peripheral extension. Diffuse cerebral edema without downward herniation. 2. Soft tissue hematoma overlying the right frontal skull. 3. No facial fracture. 4.  No acute fracture or malalignment of the spine. 5. Extensive partially visualized multifocal patchy airspace opacities likely due to multifocal pneumonia and/or ARDS. 6. These critical results were called by telephone at  the time of interpretation on 12/04/2019 at 2:47 am to provider [facility] , who verbally acknowledged these results. Electronically Signed   By: Jonna Clark M.D.   On: 12-04-19 02:50    Microbiology No results found for this or any previous visit (from the past 240 hour(s)).  Lab Basic Metabolic Panel: No results for input(s): NA, K, CL, CO2, GLUCOSE, BUN, CREATININE, CALCIUM, MG, PHOS in the last 168 hours. Liver Function Tests: No results for input(s): AST, ALT, ALKPHOS, BILITOT, PROT, ALBUMIN in the last 168 hours. No results for input(s): LIPASE, AMYLASE in the last 168 hours. No results for input(s): AMMONIA in the last 168 hours. CBC: No results for input(s): WBC, NEUTROABS, HGB, HCT, MCV, PLT in the last 168 hours. Cardiac Enzymes: No results for input(s): CKTOTAL, CKMB, CKMBINDEX, TROPONINI in the last 168 hours. Sepsis Labs: No results for input(s): PROCALCITON, WBC, LATICACIDVEN in the last 168 hours.  Procedures/Operations     Koren Bound 12/06/2019, 2:46 PM

## 2021-02-06 IMAGING — CT CT ANGIO CHEST
2 of 7 series · 17 of 46 positions shown · IV contrast (APPLIED)
Comparison: None.

CLINICAL DATA: Shortness of breath. Cardiac arrest post CPR.
Evaluate for pulmonary embolism.

EXAM:
CT ANGIOGRAPHY CHEST WITH CONTRAST
TECHNIQUE: Multidetector CT imaging of the chest was performed using the
standard protocol during bolus administration of intravenous
contrast. Multiplanar CT image reconstructions and MIPs were
obtained to evaluate the vascular anatomy.
CONTRAST:  100mL OMNIPAQUE IOHEXOL 350 MG/ML SOLN

[Series 7: thins · axial · 0.82mm/px · z∈[-575,-269]mm · 14 of 493 slices shown]
[im 28/493  lung]
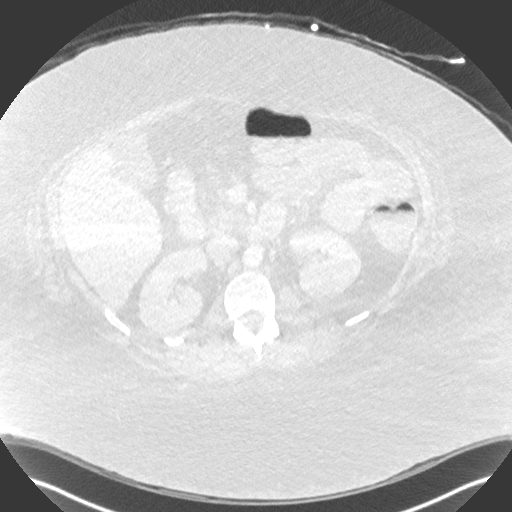
[im 55/493  soft-tissue]
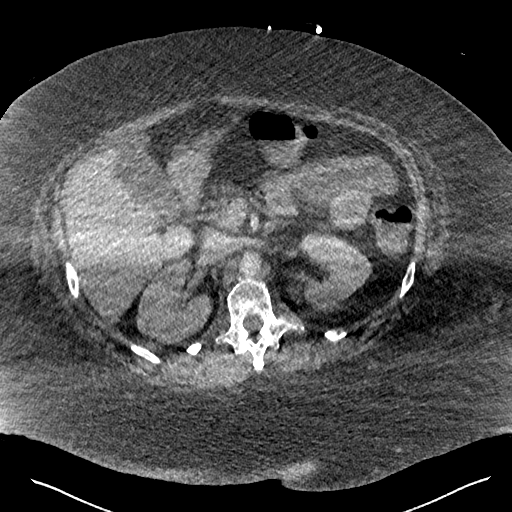
[im 110/493  lung]
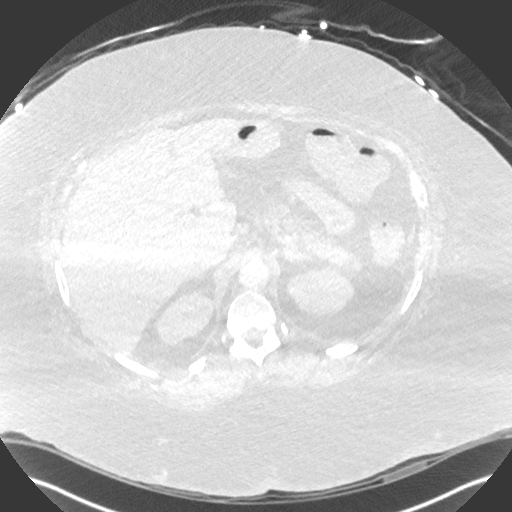
[im 137/493  soft-tissue]
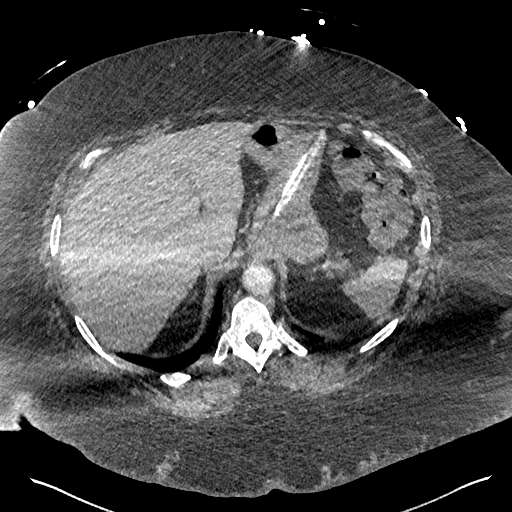
[im 165/493  lung]
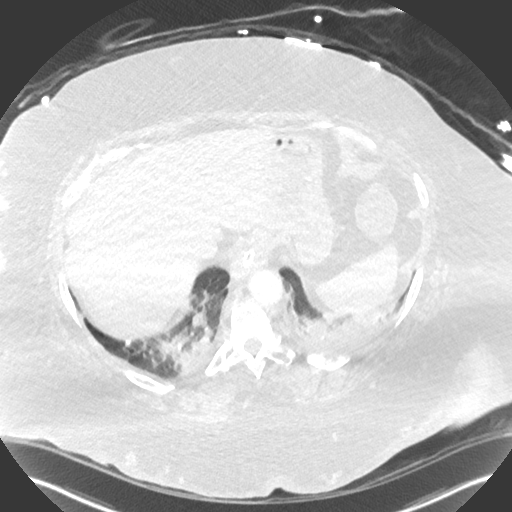
[im 192/493  soft-tissue]
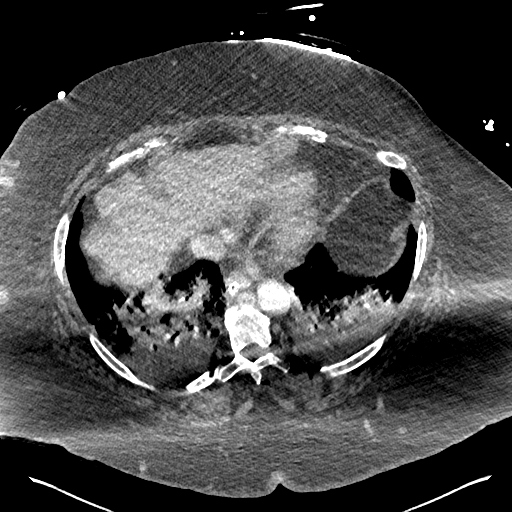
[im 219/493  lung]
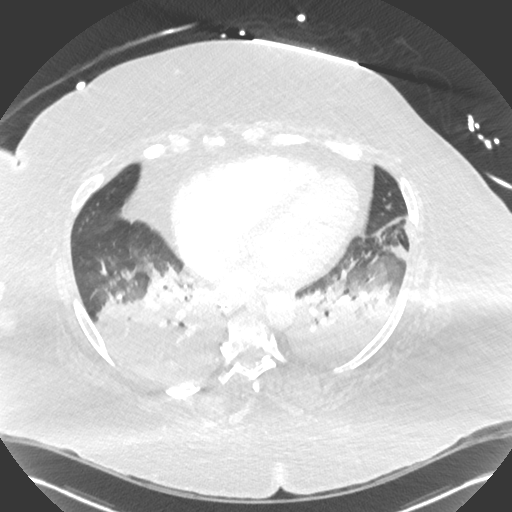
[im 274/493  soft-tissue]
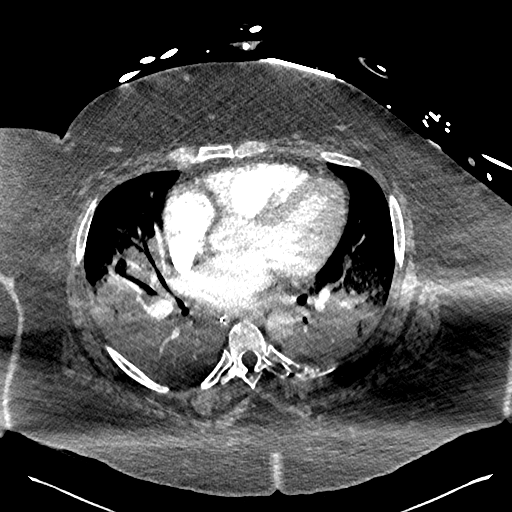
[im 301/493  lung]
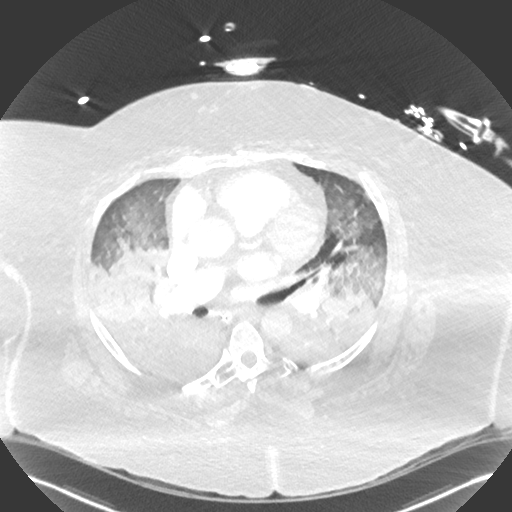
[im 329/493  soft-tissue]
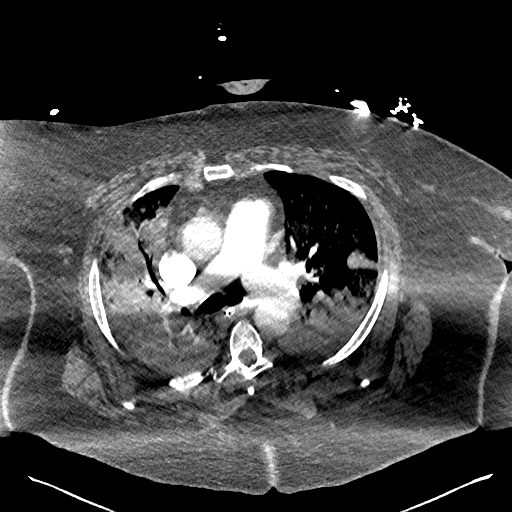
[im 356/493  lung]
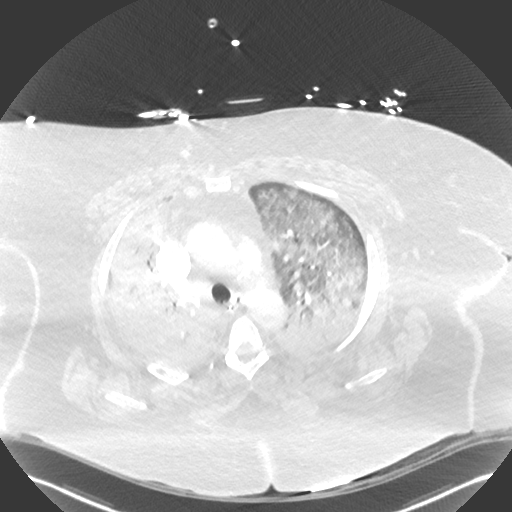
[im 383/493  soft-tissue]
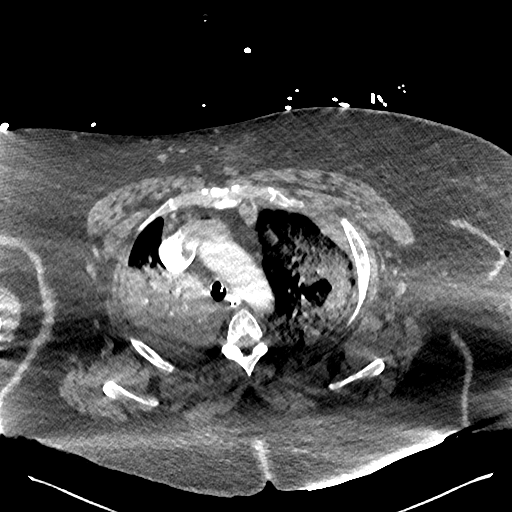
[im 438/493  lung]
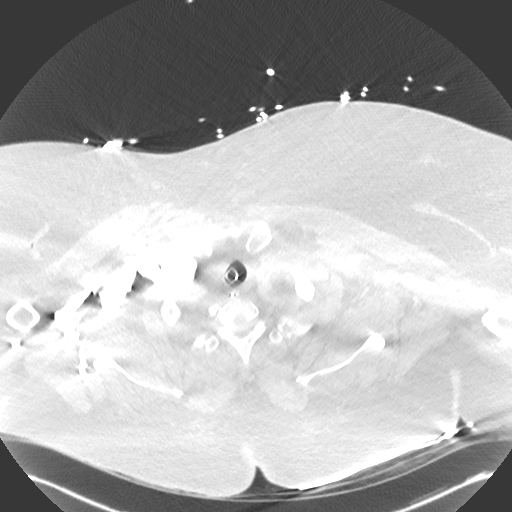
[im 465/493  soft-tissue]
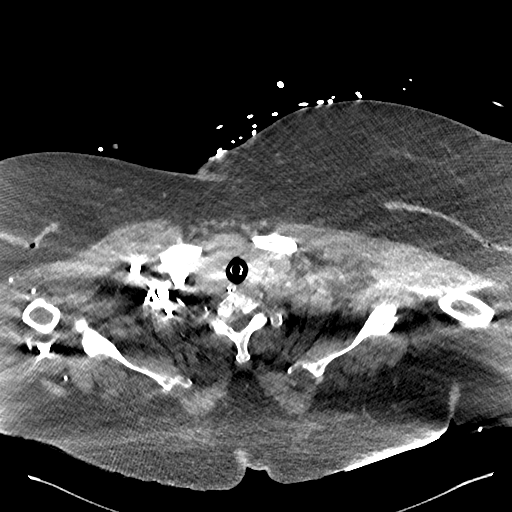

[Series 8: cor · coronal · 0.66mm/px · 3 of 198 slices shown]
[im 50/198  soft-tissue]
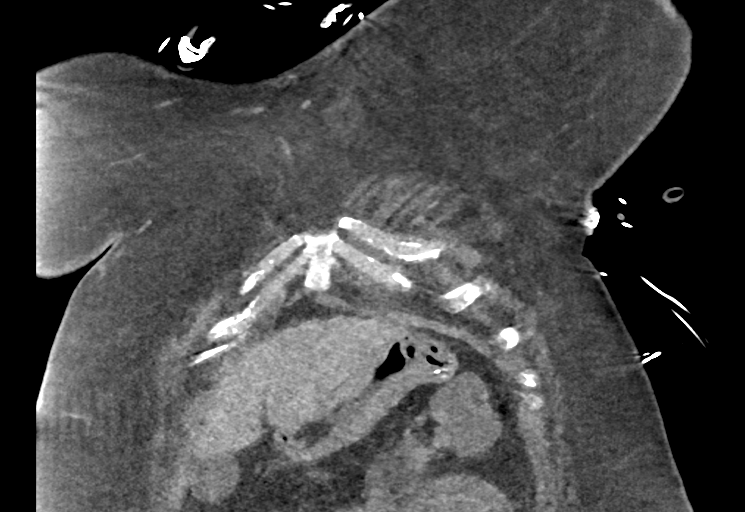
[im 99/198  soft-tissue]
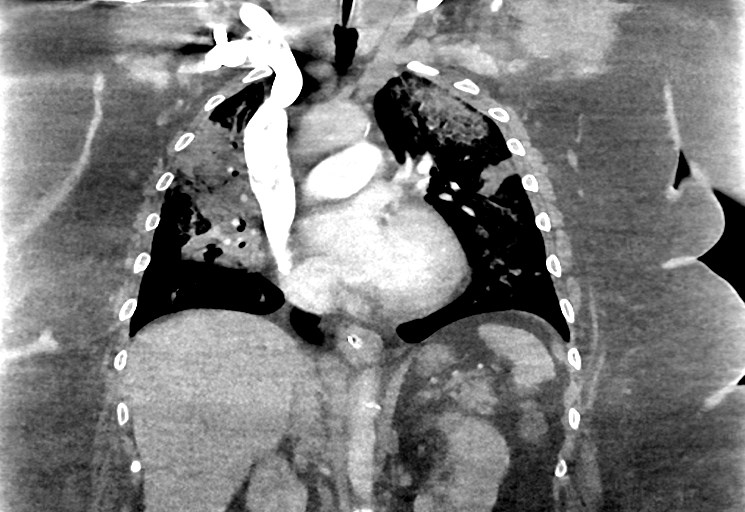
[im 148/198  soft-tissue]
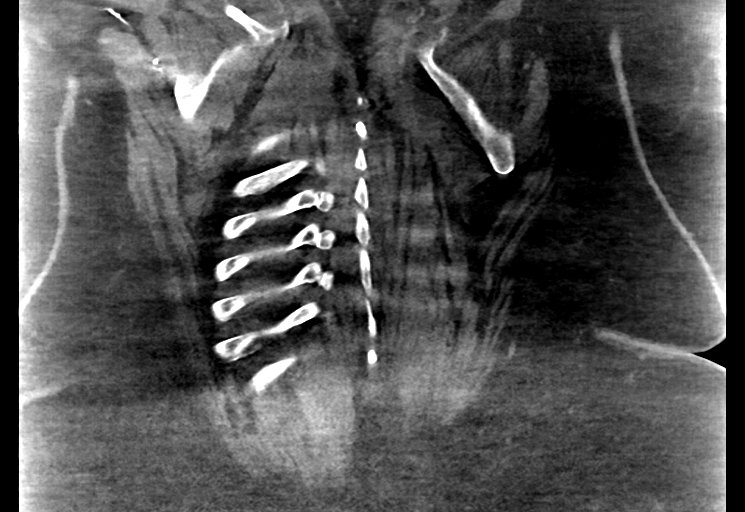

[17 of 46 positions shown; findings below may reference images not displayed]

FINDINGS: Cardiovascular: Mild cardiomegaly. Thoracic aorta is normal in
caliber and otherwise unremarkable. Pulmonary arterial system is
well opacified without evidence of emboli. Remaining vascular
structures are unremarkable.

Mediastinum/Nodes: No mediastinal or hilar adenopathy. Remaining
mediastinal structures are unremarkable.

Lungs/Pleura: Endotracheal tube has tip 3.4 cm above the carina.
There is significant consolidation throughout both lungs right worse
than left. This consolidation is fairly dense over the posterior
lower lobes and right upper lobe. No evidence of effusion. Airways
otherwise unremarkable.

Upper Abdomen: Nasogastric tube has tip over the stomach. Possible
tiny nonobstructing right renal stones.

Musculoskeletal: Mild degenerative change of the spine. Several
bilateral anterior acute rib fractures left worse than right
involving left ribs 2 through 8 and right ribs 4 through 6.

Review of the MIP images confirms the above findings.
IMPRESSION: 1.  No evidence of pulmonary embolism.

2. Significant bilateral airspace consolidation right worse than
left which may be due to lobar collapse/atelectasis versus
multifocal infection. No effusion.

3. Multiple bilateral acute anterior rib fractures left worse than
right as described.

4.  Tubes and lines as described.

5.  Possible tiny nonobstructing right renal stones.
